# Patient Record
Sex: Male | Born: 1980 | Hispanic: Yes | Smoking: Never smoker
Health system: Southern US, Community
[De-identification: ages and names within clinical notes are randomized; demographics above are authoritative.]

## PROBLEM LIST (undated history)

## (undated) HISTORY — PX: FRACTURE SURGERY: SHX138

## (undated) HISTORY — PX: NASAL SINUS SURGERY: SHX719

---

## 2007-07-02 ENCOUNTER — Emergency Department (HOSPITAL_COMMUNITY): Admission: EM | Admit: 2007-07-02 | Discharge: 2007-07-03 | Payer: Self-pay | Admitting: Emergency Medicine

## 2011-06-05 LAB — URINALYSIS, ROUTINE W REFLEX MICROSCOPIC
Bilirubin Urine: NEGATIVE
Glucose, UA: NEGATIVE
Hgb urine dipstick: NEGATIVE
Ketones, ur: NEGATIVE
Nitrite: NEGATIVE
Protein, ur: NEGATIVE
Specific Gravity, Urine: 1.029
pH: 6.5

## 2013-04-30 ENCOUNTER — Emergency Department (HOSPITAL_BASED_OUTPATIENT_CLINIC_OR_DEPARTMENT_OTHER)
Admission: EM | Admit: 2013-04-30 | Discharge: 2013-04-30 | Disposition: A | Discharge status: Home / Self Care | Payer: Worker's Compensation | Attending: Emergency Medicine | Admitting: Emergency Medicine

## 2013-04-30 ENCOUNTER — Emergency Department (HOSPITAL_BASED_OUTPATIENT_CLINIC_OR_DEPARTMENT_OTHER): Payer: Worker's Compensation

## 2013-04-30 ENCOUNTER — Encounter (HOSPITAL_BASED_OUTPATIENT_CLINIC_OR_DEPARTMENT_OTHER): Payer: Self-pay | Admitting: *Deleted

## 2013-04-30 DIAGNOSIS — S92302A Fracture of unspecified metatarsal bone(s), left foot, initial encounter for closed fracture: Secondary | ICD-10-CM

## 2013-04-30 DIAGNOSIS — Y929 Unspecified place or not applicable: Secondary | ICD-10-CM | POA: Insufficient documentation

## 2013-04-30 DIAGNOSIS — IMO0002 Reserved for concepts with insufficient information to code with codable children: Secondary | ICD-10-CM | POA: Insufficient documentation

## 2013-04-30 DIAGNOSIS — Y9389 Activity, other specified: Secondary | ICD-10-CM | POA: Insufficient documentation

## 2013-04-30 MED ORDER — HYDROCODONE-ACETAMINOPHEN 5-325 MG PO TABS: 2.0000 | ORAL_TABLET | ORAL | Status: DC | PRN

## 2013-04-30 NOTE — ED Notes (Signed)
D/c home with rx x 1 for hydrocodone and note for work- friend at bedside assisting with interpreting- pt's english limited

## 2013-04-30 NOTE — ED Provider Notes (Signed)
CSN: [id number]     Arrival date & time 04/30/13  1745 History   First MD Initiated Contact with Patient 04/30/13 1838     Chief Complaint  Patient presents with  . Finger Injury   (Consider location/radiation/quality/duration/timing/severity/associated sxs/prior Treatment) Patient is a 32 y.o. male presenting with hand pain. The history is provided by the patient. No language interpreter was used.  Hand Pain This is a new problem. The current episode started today. The problem occurs constantly. The problem has been gradually worsening. Associated symptoms include joint swelling and myalgias. Nothing aggravates the symptoms. He has tried nothing for the symptoms. The treatment provided moderate relief.  Pt complains of pain in left 4th finger.  History reviewed. No pertinent past medical history. Past Surgical History  Procedure Laterality Date  . Nasal sinus surgery     No family history on file. History  Substance Use Topics  . Smoking status: Never Smoker   . Smokeless tobacco: Not on file  . Alcohol Use: Yes     Comment: occassionally    Review of Systems  Musculoskeletal: Positive for myalgias and joint swelling.  All other systems reviewed and are negative.    Allergies  Review of patient's allergies indicates no known allergies.  Home Medications  No current outpatient prescriptions on file. BP 125/77  Pulse 68  Temp(Src) 98.9 F (37.2 C) (Oral)  Resp 16  SpO2 100% Physical Exam  Nursing note and vitals reviewed. Constitutional: He is oriented to person, place, and time. He appears well-developed and well-nourished.  HENT:  Head: Normocephalic.  Musculoskeletal: He exhibits tenderness.  Tender swollen left 4th finger,   Pain with range of motion,  nv and ns intact  Neurological: He is alert and oriented to person, place, and time. He has normal reflexes.  Skin: Skin is warm.  Psychiatric: He has a normal mood and affect.    ED Course  Procedures  (including critical care time) Labs Review Labs Reviewed - No data to display Imaging Review Dg Finger Ring Left  04/30/2013   *RADIOLOGY REPORT*  Clinical Data: Blow to the left ring finger.  Pain.  LEFT RING FINGER 2+V  Comparison: None.  Findings: The patient has a nondisplaced fracture through the mid diaphysis of the proximal phalanx of the left ring finger with associated soft tissue swelling.  IMPRESSION: Nondisplaced fracture mid diaphysis proximal phalanx left ring finger.   Original Report Authenticated By: Holley Dexter, M.D.    MDM   1. Fracture of 4th metatarsal, left, closed, initial encounter    Finger splint, ice,   Follow up with Dr. Pearletha Forge for recheck next week    Elson Areas, PA-C 04/30/13 2027

## 2013-04-30 NOTE — ED Notes (Signed)
Patient states he was working today and hit his left fourth finger with a hammer.

## 2013-04-30 NOTE — ED Provider Notes (Signed)
Medical screening examination/treatment/procedure(s) were performed by non-physician practitioner and as supervising physician I was immediately available for consultation/collaboration.   Kenzleigh Sedam, MD 04/30/13 2348 

## 2014-01-25 ENCOUNTER — Ambulatory Visit: Payer: Self-pay | Admitting: Internal Medicine

## 2014-01-25 VITALS — BP 122/78 | HR 78 | Temp 98.7°F | Resp 16 | Ht 69.0 in | Wt 144.4 lb

## 2014-01-25 DIAGNOSIS — N342 Other urethritis: Secondary | ICD-10-CM

## 2014-01-25 DIAGNOSIS — R369 Urethral discharge, unspecified: Secondary | ICD-10-CM

## 2014-01-25 DIAGNOSIS — R300 Dysuria: Secondary | ICD-10-CM

## 2014-01-25 LAB — POCT URINALYSIS DIPSTICK
Bilirubin, UA: NEGATIVE
Glucose, UA: NEGATIVE
KETONES UA: NEGATIVE
Nitrite, UA: NEGATIVE
PH UA: 6.5
PROTEIN UA: NEGATIVE
Spec Grav, UA: 1.015
Urobilinogen, UA: 0.2

## 2014-01-25 LAB — POCT UA - MICROSCOPIC ONLY
CASTS, UR, LPF, POC: NEGATIVE
CRYSTALS, UR, HPF, POC: NEGATIVE
MUCUS UA: NEGATIVE
YEAST UA: NEGATIVE

## 2014-01-25 MED ORDER — CEFTRIAXONE SODIUM 1 G IJ SOLR
500.0000 mg | Freq: Once | INTRAMUSCULAR | Status: AC
  Administered 2014-01-25: 500 mg via INTRAMUSCULAR

## 2014-01-25 MED ORDER — DOXYCYCLINE HYCLATE 100 MG PO TABS: 100.0000 mg | ORAL_TABLET | Freq: Two times a day (BID) | ORAL | Status: DC

## 2014-01-25 NOTE — Patient Instructions (Signed)
Enfermedades de transmisin sexual (Sexually Transmitted Disease) Una enfermedad de transmisin sexual (ETS) es toda infeccin que se contagia (trasmite) de Neomia Dear persona a otra durante la actividad sexual. Puede transmitirse a travs de la saliva, el semen, la [place], el moco vaginal o la [place]. Las enfermedades de transmisin sexual ms frecuentes son:   Bettey Mare.  Clamidia.  Sfilis.  VIH y [place].  Herpes genital.  Hepatitis B y C.  Tricomonas.  Virus del papiloma humano - (VPH).  [place].  Escabiosis.  [place].  Vaginosis bacteriana. CULES SON LAS CAUSAS DE LAS ETS? Una ETS se transmite por bacterias, virus o parsitos. Las ETS se contagian durante la actividad sexual si una de las personas est infectada. Sin embargo, tambin puede trasmitirse por medios no sexuales. Las ETS se trasmiten despus de:   Tener contacto sexual con un compaero infectado.  Compartir juguetes sexuales.  Compartir agujas con una persona infectada o intercambiar piercings o agujas de tatuajes.  Tener un contacto ntimo con los genitales, la boca, o la zona anal de una persona infectada.  Exposicin a los fluidos infectados durante el nacimiento. CULES SON LOS SIGNOS Y SNTOMAS DE UNA INFECCIN POR ETS? Las distintas enfermedades de transmisin sexual tienen diferentes sntomas. Algunas personas no tienen sntomas. Si se presentan sntomas, stos pueden ser:   Miccin dolorosa o con sangre.  Dolor en la pelvis, el abdomen, la vagina, el ano, la garganta o los ojos.  Erupcin cutnea, picazn, irritacin, llagas (lesiones), lceras, o verrugas en la zona genital o anal.  Flujo vaginal anormal con o sin mal olor.  En los hombres, secrecin peniana.  Grant Ruts.  Dolor o Physiological scientist.  Inflamacin de los ganglios en la zona de la ingle.  Piel y ojos amarillos (ictericia). Esto se observa con la hepatitis.  Hinchazn de los  testculos.  Infertilidad.  Llagas y ampollas en la boca. CMO SE DIAGNOSTICAN LAS ETS? Para realizar un diagnstico, el mdico:   Tax inspector.  Realizar un examen fsico.  [place] de cualquier secrecin que presente para analizarla.  Tomar una muestra de la garganta, el cuello uterino, la abertura del pene, el recto o la vagina para el [place].  Analizar una muestra de la primera orina de la [place].  Le pedir DIRECTV.  Si corresponde, le tomar la [place] para un [place].  Har una colposcopa.  Har una laparoscopa. CMO SE TRATAN LAS ETS? El tratamiento depende de cul sea la ETS. Algunas ETS pueden tratarse pero no tienen [place].   Clamidia, gonorrea, tricomonas y sfilis pueden curarse con antibiticos.  El herpes genital, la hepatitis y el VIH pueden tratarse, pero no curarse, con medicamentos recetados. Los [employer].  Las Physiological scientist VPH se extirpan utilizando medicamentos, congelamiento, Mining engineer (electro-cauterizacin) o [place]. Las IT consultant.  El VPH es un virus y no puede curarse con medicamentos o [place]. Sin embargo pueden extirparse zonas anormales del cuello del tero, la vagina o la vulva.  Si el diagnstico se confirma por un cultivo u otro mtodo, sus compaeros sexuales ms recientes necesitan recibir TEFL teacher. Debe realizarlo aunque no tenga problemas o su cultivo o evaluacin sean negativos. No debe tener relaciones sexuales hasta que el profesional lo autorice. CMO PUEDO REDUCIR EL RIESGO DE TENER UNA ETS?  Use condones de ltex y lubricantes solubles en agua durante la actividad sexual.No utilice vaselina ni aceites.  Aplquese las vacunas para el HPV y la hepatitis. Si  no ha recibido Scientist, product/process development, consulte a su mdico si debe aplicarse una o ambas.  Evite las prcticas sexuales riesgosas que puedan lacerar la piel. QU DEBO HACER SI  CREO QUE TENGO UNA ETS?  Consulte a su mdico.  Informar a todos sus International aid/development worker. Ellos deben ser evaluados y recibir tratamiento.  No tenga relaciones sexuales hasta que el mdico lo autorice. CUNDO PEDIR AYUDA? Solicite atencin mdica de inmediato si:  Sufre un dolor abdominal intenso.  Usted es hombre y nota hinchazn o dolor en los testculos.  Usted es mujer y nota hinchazn o dolor en la vagina. Document Released: 05/23/2005 Document Revised: 06/03/2013 [facility] Patient Information 2014 [place], [place]. Bettey Mare (Gonorrhea) La gonorrea es una infeccin que puede causar graves problemas. Si no se trata puede:   Producir daos en los rganos femeninos o masculinos.  Es la causa de que algunas mujeres no puedan tener nios (esterilidad).  Puede daar al feto si la mujer infectada est embarazada. Es importante que reciba tratamiento para la gonorrea lo antes posible. Es necesario que todos sus compaeros sexuales sean examinados para diagnosticar la infeccin.  CAUSAS  La causa de la gonorrea es una bacteria llamada Neisseria gonorrhoeae. La infeccin se contagia de una persona a otra, generalmente por contacto sexual (por va anal, vaginal, u oral). Un recin nacido puede contraer la infeccin de su madre durante el [place].  SNTOMAS  Algunas personas con gonorrea no tienen sntomas. Los sntomas pueden ser diferentes en hombres y en mujeres.  Mujeres: Los sntomas ms frecuentes son:   Radiographer, therapeutic parte baja del abdomen.  Tener fiebre con o sin escalofros. Otros sntomas son:   Flujo vaginal anormal.  Relaciones sexuales dolorosas.  Ardor o picazn en la vagina o en los labios.  Sangrado vaginal anormal.  Dolor al Beatrix Shipper.  Dolor de Set designer duracin (crnico) en la zona baja del abdomen, especialmente durante la menstruacin o las [employer].  Imposibilidad de quedar embarazada.  Trabajo de [employer].  Irritacin, dolor, sangrado o  secrecin por el recto. Esto puede ocurrir si la infeccin se contagi durante sexo anal.  Dolor de garganta o ganglios linfticos inflamados en el cuello. Esto puede ocurrir si la infeccin se contagi durante sexo oral. Varones Los sntomas ms frecuentes son:   Secrecin por el pene.  Sensacin de dolor o ardor al [employer].  Dolor o hinchazn en los testculos. Otros sntomas son:   Irritacin, dolor, sangrado o secrecin por el recto. Esto puede ocurrir si la infeccin se contagi durante sexo anal.  Dolor de garganta, fiebre o ganglios linfticos inflamados en el cuello. Esto puede ocurrir si la infeccin se contagi durante sexo oral. DIAGNSTICO  El diagnstico se realiza luego de un examen fsico y de examinar una muestra de secreciones en el microscopio para detectar la presencia de la bacteria. La secrecin podr tomarse de la uretra, el cuello del tero, la garganta o el recto.  TRATAMIENTO  La gonorrea se trata con antibiticos. Es importante que inicie el tratamiento lo antes posible. El tratamiento precoz puede prevenir el desarrollo de otras enfermedades.  INSTRUCCIONES PARA EL CUIDADO EN EL HOGAR   Slo tome medicamentos de venta libre o recetados para Primary school teacher, Environmental health practitioner o bajar la fiebre, segn las indicaciones de su mdico.  Administre los antibiticos como se le indic. Finalice la prescripcin completa, aunque se sienta mejor. Un tratamiento incompleto lo pondr en riesgo de continuar con la infeccin.  No tenga relaciones Geographical information systems officer  el tratamiento, o segn las indicaciones del mdico.  Concurra a las consultas de control con su mdico segn las indicaciones.  No todos los resultados estarn disponibles durante su visita. En este caso, tenga otra entrevista con su mdico para conocerlos. No suponga que es normal si no tiene noticias de su mdico o del establecimiento de salud. Es Copyimportante el seguimiento de todos los [place]resultados de [place]  anlisis.  Si el estudio para la D.R. Horton, Incgonorrea le dio positivo, informe a sus Chiropractorcompaeros sexuales recientes. Ellos deben ITT Industrieshacerse los estudios para la gonorrea aunque no tengan sntomas. Podran necesitar tratamiento aunque el resultado del estudio haya sido negativo para la [place] Viewgonorrea. SOLICITE ATENCIN MDICA SI:   Tiene una reaccin adversa a los Research scientist (physical sciences)medicamentos que le prescribieron. Este puede incluir:  Una erupcin.  Nuseas.  Vmitos.  Diarrea.  Sus sntomas no han mejorado despus de 3 das de tratamiento con antibiticos.  Los sntomas empeoran.  Aumenta el dolor en los testculos (los hombres) o en el abdomen (las mujeres). SOLICITE ATENCIN MDICA DE INMEDIATO SI:  Tiene fiebre o sntomas persistentes durante ms de 2 - 3 das.  Tiene fiebre y los sntomas empeoran repentinamente. ASEGRESE DE QUE:   Comprende estas instrucciones.  Controlar su afeccin.  Recibir ayuda de inmediato si no mejora o si empeora. Document Released: 05/23/2005 Document Revised: 06/03/2013 [facility] Patient Information 2014 [place], [place]. Sexually Transmitted Disease A sexually transmitted disease (STD) is a disease or infection that may be passed (transmitted) from person to person, usually during sexual activity. This may happen by way of saliva, semen, blood, vaginal mucus, or urine. Common STDs include:   Gonorrhea.   Chlamydia.   Syphilis.   HIV and AIDS.   Genital herpes.   Hepatitis B and C.   Trichomonas.   Human papillomavirus (HPV).   Pubic lice.   Scabies.  Mites.  Bacterial vaginosis. WHAT ARE CAUSES OF STDs? An STD may be caused by bacteria, a virus, or parasites. STDs are often transmitted during sexual activity if one person is infected. However, they may also be transmitted through nonsexual means. STDs may be transmitted after:   Sexual intercourse with an infected person.   Sharing sex toys with an infected person.   Sharing needles with an  infected person or using unclean piercing or tattoo needles.  Having intimate contact with the genitals, mouth, or rectal areas of an infected person.   Exposure to infected fluids during birth. WHAT ARE THE SIGNS AND SYMPTOMS OF STDs? Different STDs have different symptoms. Some people may not have any symptoms. If symptoms are present, they may include:   Painful or bloody urination.   Pain in the pelvis, abdomen, vagina, anus, throat, or eyes.   Skin rash, itching, irritation, growths, sores (lesions), ulcerations, or warts in the genital or anal area.  Abnormal vaginal discharge with or without bad odor.   Penile discharge in men.   Fever.   Pain or bleeding during sexual intercourse.   Swollen glands in the groin area.   Yellow skin and eyes (jaundice). This is seen with hepatitis.   Swollen testicles.  Infertility.  Sores and blisters in the mouth. HOW ARE STDs DIAGNOSED? To make a diagnosis, your health care provider may:   Take a medical history.   Perform a physical exam.   Take a sample of any discharge for examination.  Swab the throat, cervix, opening to the penis, rectum, or vagina for testing.  Test a sample of your first morning urine.  Perform blood tests.   Perform a Pap smear, if this applies.   Perform a colposcopy.   Perform a laparoscopy.  HOW ARE STDs TREATED? Treatment depends on the STD. Some STDs may be treated but not cured.   Chlamydia, gonorrhea, trichomonas, and syphilis can be cured with antibiotics.   Genital herpes, hepatitis, and HIV can be treated, but not cured, with prescribed medicines. The medicines lessen symptoms.   Genital warts from HPV can be treated with medicine or by freezing, burning (electrocautery), or surgery. Warts may come back.   HPV cannot be cured with medicine or surgery. However, abnormal areas may be removed from the cervix, vagina, or vulva.   If your diagnosis is confirmed,  your recent sexual partners need treatment. This is true even if they are symptom-free or have a negative culture or evaluation. They should not have sex until their health care providers say it is OK. HOW CAN I REDUCE MY RISK OF GETTING AN STD?  Use latex condoms, dental dams, and water-soluble lubricants during sexual activity. Do not use petroleum jelly or oils.  Get vaccinated for HPV and hepatitis. If you have not received these vaccines in the past, talk to your health care provider about whether one or both might be right for you.   Avoid risky sex practices that can break the skin.  WHAT SHOULD I DO IF I THINK I HAVE AN STD?  See your health care provider.   Inform all sexual partners. They should be tested and treated for any STDs.  Do not have sex until your health care provider says it is OK. WHEN SHOULD I GET HELP? Seek immediate medical care if:  You develop severe abdominal pain.  You are a man and notice swelling or pain in the testicles.  You are a woman and notice swelling or pain in your vagina. Document Released: 11/03/2002 Document Revised: 06/03/2013 Document Reviewed: 03/03/2013 [facility] Patient Information 2014 [place], [place].

## 2014-01-25 NOTE — Progress Notes (Signed)
   Subjective:    Patient ID: Steven Farley, male    DOB: 01-Jun-1981, 33 y.o.   MRN: [id number]  HPI    Review of Systems     Objective:   Physical Exam        Assessment & Plan:

## 2014-01-25 NOTE — Progress Notes (Signed)
   Subjective:    Patient ID: Steven Farley, male    DOB: 11/19/1980, 33 y.o.   MRN: [id number]  HPI 33 year old male here for dysuria. The pain started yesterday. He is having a discharge right before he urinates. Tip of penis is swollen    Review of Systems     Objective:   Physical Exam  Constitutional: He appears well-developed and well-nourished. No distress.  HENT:  Head: Normocephalic.  Eyes: Pupils are equal, round, and reactive to light. No scleral icterus.  Neck: Normal range of motion.  Pulmonary/Chest: Effort normal.  Abdominal: Hernia confirmed negative in the right inguinal area and confirmed negative in the left inguinal area.  Genitourinary: Testes normal. Circumcised. Penile erythema and penile tenderness present. Discharge found.  Purulent penile discharge  Lymphadenopathy:       Right: No inguinal adenopathy present.       Left: No inguinal adenopathy present.     Results for orders placed in visit on 01/25/14  POCT URINALYSIS DIPSTICK      Result Value Ref Range   Color, UA yellow     Clarity, UA clear     Glucose, UA neg     Bilirubin, UA neg     Ketones, UA neg     Spec Grav, UA 1.015     Blood, UA small     pH, UA 6.5     Protein, UA neg     Urobilinogen, UA 0.2     Nitrite, UA neg     Leukocytes, UA small (1+)    POCT UA - MICROSCOPIC ONLY      Result Value Ref Range   WBC, Ur, HPF, POC 8-12     RBC, urine, microscopic 2-5     Bacteria, U Microscopic 1+     Mucus, UA neg     Epithelial cells, urine per micros 0-1     Crystals, Ur, HPF, POC neg     Casts, Ur, LPF, POC neg     Yeast, UA neg          Assessment & Plan:  Rocephin 5oomg Doxycycline 100mg  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

## 2014-01-26 LAB — GC/CHLAMYDIA PROBE AMP
CT PROBE, AMP APTIMA: NEGATIVE
GC Probe RNA: POSITIVE — AB

## 2014-01-27 ENCOUNTER — Encounter: Payer: Self-pay | Admitting: *Deleted

## 2014-11-09 ENCOUNTER — Emergency Department (INDEPENDENT_AMBULATORY_CARE_PROVIDER_SITE_OTHER)
Admission: EM | Admit: 2014-11-09 | Discharge: 2014-11-09 | Disposition: A | Discharge status: Home / Self Care | Payer: Self-pay | Source: Home / Self Care | Attending: Family Medicine | Admitting: Family Medicine

## 2014-11-09 ENCOUNTER — Other Ambulatory Visit (HOSPITAL_COMMUNITY)
Admission: RE | Admit: 2014-11-09 | Discharge: 2014-11-09 | Disposition: A | Discharge status: Home / Self Care | Payer: Self-pay | Source: Ambulatory Visit | Attending: Family Medicine | Admitting: Family Medicine

## 2014-11-09 ENCOUNTER — Encounter (HOSPITAL_COMMUNITY): Payer: Self-pay | Admitting: Emergency Medicine

## 2014-11-09 DIAGNOSIS — Z113 Encounter for screening for infections with a predominantly sexual mode of transmission: Secondary | ICD-10-CM | POA: Insufficient documentation

## 2014-11-09 DIAGNOSIS — R350 Frequency of micturition: Secondary | ICD-10-CM

## 2014-11-09 LAB — POCT URINALYSIS DIP (DEVICE)
Bilirubin Urine: NEGATIVE
GLUCOSE, UA: NEGATIVE mg/dL
Ketones, ur: NEGATIVE mg/dL
Leukocytes, UA: NEGATIVE
Nitrite: NEGATIVE
PH: 6.5 (ref 5.0–8.0)
Protein, ur: NEGATIVE mg/dL
UROBILINOGEN UA: 0.2 mg/dL (ref 0.0–1.0)

## 2014-11-09 LAB — GLUCOSE, CAPILLARY: GLUCOSE-CAPILLARY: 91 mg/dL (ref 70–99)

## 2014-11-09 MED ORDER — DOXYCYCLINE HYCLATE 100 MG PO CAPS: 100.0000 mg | ORAL_CAPSULE | Freq: Two times a day (BID) | ORAL | Status: DC

## 2014-11-09 NOTE — Discharge Instructions (Signed)
Poliaquiuria  °(Urinary Frequency) ° El número de veces que orina una persona sana depende de la cantidad de líquido que ingiere y la cantidad de líquido que pierde. Cuando hace calor y hay mucha humedad, la persona transpira más y la respiración es más rápida. Estos factores disminuyen la frecuencia de la micción que se considera normal.  °La cantidad de líquido que se bebe es fácil de determinar, pero la cantidad de líquido que se pierde es más difícil de calcular.  °El líquido se pierde en dos formas:  °· La pérdida perceptible de líquidos se mide por la cantidad de orina que se elimina. También hay pérdida de líquido también con la diarrea. °· La pérdida imperceptible de líquidos es más difícil de medir. La causa es la evaporación. La pérdida insensible de líquido se produce a través de la respiración y la sudoración. Por lo general oscila alrededor de un litro por día. °En temperaturas y niveles de actividad normales una persona promedio puede orinar 4-7 veces en un período de 24 horas. La necesidad de orinar con más frecuencia podría indicar que hay un problema. Si una persona orina entre 4 y 7 veces en 24 horas y elimina grandes volúmenes cada vez, podría indicar un problema diferente del que orine 4 a 7 veces al día y elimine pequeños volúmenes. El momento en que orina también es importante. Generalmente se orina durante las horas de vigilia. Levantarse con frecuencia por la noche puede indicar algunos problemas.  °CAUSAS  °La vejiga es el órgano que se encuentra en la zona baja del abdomen y que contiene la orina. Como un globo, se hincha un poco a medida que se llena. Las terminales nerviosas lo perciben y envían la información de que es hora de ir al baño. Hay una serie de causas por las que se siente la necesidad de orinar con más frecuencia de lo habitual. Ellos son:  °· Infección del tracto urinario. Esto generalmente se asocia con otros síntomas tales como ardor al orinar. °· En hombres, los problemas  de la próstata (una glándula del tamaño de una nuez que se encuentra cerca del conducto por el que se elimina la orina del organismo). Hay dos razones por las que la próstata puede causar un aumento en la frecuencia de la micción: °¨ El agrandamiento de la próstata que no deja que la vejiga se vacíe bien. Si la vejiga se vacía a medias sólo tiene la mitad de la capacidad de llenarla antes de orinar de nuevo. °¨ Los nervios de la vejiga se vuelven más hipersensibles a un aumento del tamaño de la próstata, incluso si la vejiga se vacía completamente. °· Embarazo. °· Obesidad. El exceso de peso causa más problemas en las mujeres que en los hombres. °· Cálculos u otros problemas en la vejiga. °· La cafeína. °· Alcohol. °· Medicamentos. Por ejemplo, los medicamentos que permiten eliminar el exceso de líquido del organismo (diuréticos) aumentan la producción de orina. Algunos medicamentos deben tomarse con mucho líquido. °· Debilidad muscular o nerviosa. Podría ser el resultado de una lesión en la médula espinal, un ictus, esclerosis múltiple o la enfermedad de Parkinson. °· Cuando se ha sufrido diabetes durante mucho tiempo, puede haber una disminución de la sensación de la vejiga. Esta pérdida de sensibilidad hace que sea más difícil detectar que hay que vaciar la vejiga. Durante muchos años la vejiga se expande por sobrellenado constante. Esto debilita sus músculos de modo que la vejiga no se vacía bien y tiene menos capacidad   para llenarse nuevamente. °· Cistitis intersticial (también llamado síndrome de vejiga dolorosa). La enfermedad se desarrolla debido a que los tejidos que recubren el interior de la vejiga se inflaman (la inflamación es la manera que tiene el organismo de reaccionar a una lesión o infección). Causa dolor y necesidad frecuente de orinar. Ocurre con más frecuencia en mujeres que en hombres. °DIAGNÓSTICO  °· Para diagnosticar la causa de la poliaquiuria, el médico: °¨ Le preguntará sobre los  síntomas. °¨ Preguntará acerca de su salud en general. Incluirá preguntas sobre los medicamentos que esté tomando. °¨ Le hará un examen físico. °· Indicará algunos estudios. Estos pueden incluir: °¨ Un análisis de sangre para detectar diabetes u otros problemas de salud que podrían estar contribuyendo al problema. °¨ Análisis de orina. Análisis de orina para medir el flujo de orina y la presión sobre la vejiga. °¨ Estudios del sistema neurológico (el cerebro, la médula espinal y los nervios). Este es el sistema que detecta la sensación de orinar. °¨ Estudios de la vejiga para verificar si se vacía por completo al orinar. °¨ Citoscopía. En este estudio se utiliza un tubo delgado con una pequeña cámara. Podrá observarse el interior de la uretra y la vejiga para ver si hay problemas. °¨ Diagnóstico por imágenes. Le administrarán una sustancia de contraste y luego le pedirán que orine Luego se toman radiografías de la vejiga para ver su funcionamiento. °TRATAMIENTO  °Es importante que sea evaluado para determinar si la cantidad o la frecuencia con la que orina es anormal o inusual. Si se comprueba que no es normal, debe determinarse la causa y por lo general se puede hallar con facilidad. Según sea la causa, el tratamiento podría incluir medicamentos, la estimulación de los nervios o cirugía.  °No hay muchas cosas que usted pueda hacer por su cuenta para modificar la poliaquiuria. Es importante equilibrar la ingestión de líquidos necesaria para compensar su actividad y la temperatura. Los problemas médicos serán diagnosticados y tratados por su médico. No hay entrenamiento de la vejiga en particular, como los ejercicios de Kegel que usted pueda hacer para mejorar la poliaquiuria. Este es un ejercicio que normalmente deben realizar las personas que tienen pérdidas de orina cuando se ríen, tosen o estornudan.  °INSTRUCCIONES PARA EL CUIDADO EN EL HOGAR  °· Tome todos los medicamentos que su médico le ha recetado o  sugerido. Siga cuidadosamente las indicaciones. °· Realice cambios en su estilo de vida, según las indicaciones. Estos pueden incluir: °¨ Beber menos líquidos o beber en diferentes momentos del día. Por ejemplo, si necesita orinar con frecuencia durante la noche, deberá dejar de tomar líquidos temprano a la noche. °¨ Reducir el consumo de cafeína o de alcohol. Ambos aumentan la necesidad de orinar más de lo normal. La cafeína se encuentra en las gaseosas, el té y el chocolate. °¨ Baje de peso, si se lo indican. °· Lleve un diario o registro. Posiblemente le pedirán que registre la cantidad que bebe y cuando, y el momento en que sienta la necesidad de orinar. Esto también lo ayudará a evaluar si el tratamiento que le ha indicado el médico funciona. °SOLICITE ATENCIÓN MÉDICA SI:  °· La necesidad de orinar con frecuencia aumenta. °· Se siente más dolor o irritación al orinar. °· Observa sangre en la orina. °· Usted tiene preguntas sobre cualquier medicamento que su médico ha recomendado. °· Observa sangre, pus, aumento de la inflamación en los lugares en que le hicieron las pruebas o el tratamiento. °· La temperatura eleva   por encima de 100,5°F (38,1°C). °SOLICITE ATENCIÓN MÉDICA DE INMEDIATO SI:  °La temperatura eleva a más de 102,5°F (38,9°C).  °Document Released: 05/07/2012 °Exit[employer]® Patient Information ©2015 [employer]. This information is not intended to replace advice given to you by your health care provider. Make sure you discuss any questions you have with your health care provider. ° °

## 2014-11-09 NOTE — ED Provider Notes (Signed)
CSN: [id number]     Arrival date & time 11/09/14  1727 History   First MD Initiated Contact with Patient 11/09/14 1918     Chief Complaint  Patient presents with  . Urinary Retention   (Consider location/radiation/quality/duration/timing/severity/associated sxs/prior Treatment) HPI Comments: No concerns regarding STIs Patient reports himself to be uncircumcised   Patient is a 34 y.o. male presenting with frequency. The history is provided by the patient.  Urinary Frequency This is a new problem. The current episode started yesterday. The problem occurs constantly. The problem has not changed since onset.   History reviewed. No pertinent past medical history. Past Surgical History  Procedure Laterality Date  . Nasal sinus surgery    . Fracture surgery      Nose (1991)   Family History  Problem Relation Age of Onset  . Hypertension Brother    History  Substance Use Topics  . Smoking status: Never Smoker   . Smokeless tobacco: Not on file  . Alcohol Use: Yes     Comment: occassionally    Review of Systems  Constitutional: Negative for fever, chills and appetite change.  HENT: Negative.   Respiratory: Negative.   Cardiovascular: Negative.   Gastrointestinal: Negative.   Endocrine: Negative for polydipsia, polyphagia and polyuria.  Genitourinary: Positive for urgency and frequency. Negative for dysuria, hematuria, flank pain, decreased urine volume, discharge, penile swelling, scrotal swelling, genital sores, penile pain and testicular pain.  Musculoskeletal: Negative for myalgias and back pain.  Skin: Negative.   Allergic/Immunologic: Negative for immunocompromised state.  Neurological: Negative for dizziness, weakness and light-headedness.    Allergies  Review of patient's allergies indicates no known allergies.  Home Medications   Prior to Admission medications   Medication Sig Start Date End Date Taking? Authorizing Provider  doxycycline (VIBRAMYCIN) 100 MG  capsule Take 1 capsule (100 mg total) by mouth 2 (two) times daily. X 7 days 11/09/14   Ria Clock, PA  HYDROcodone-acetaminophen (NORCO/VICODIN) 5-325 MG per tablet Take 2 tablets by mouth every 4 (four) hours as needed. 04/30/13   Elson Areas, PA-C   BP 136/71 mmHg  Pulse 63  Temp(Src) 97.8 F (36.6 C) (Oral)  Resp 20  SpO2 100% Physical Exam  Constitutional: He is oriented to person, place, and time. He appears well-developed and well-nourished. No distress.  HENT:  Head: Normocephalic and atraumatic.  Eyes: Conjunctivae are normal.  Cardiovascular: Normal rate, regular rhythm and normal heart sounds.   Pulmonary/Chest: Effort normal and breath sounds normal.  Abdominal: Soft. Normal appearance and bowel sounds are normal. He exhibits no distension. There is no tenderness. There is no rigidity, no rebound, no guarding and no CVA tenderness.  Genitourinary:  Declines this portion of exam  Musculoskeletal: Normal range of motion.  Neurological: He is alert and oriented to person, place, and time.  Skin: Skin is warm and dry. No rash noted. No erythema.  Psychiatric: He has a normal mood and affect. His behavior is normal.  Nursing note and vitals reviewed.   ED Course  Procedures (including critical care time) Labs Review Labs Reviewed  POCT URINALYSIS DIP (DEVICE) - Abnormal; Notable for the following:    Hgb urine dipstick TRACE (*)    All other components within normal limits  URINE CULTURE  GLUCOSE, CAPILLARY  URINE CYTOLOGY ANCILLARY ONLY    Imaging Review No results found.   MDM   1. Urinary frequency     Vitals normal  Exam unremarkable UA with trace hematuria  Patient declines exam of genitalia, so I cannot elaborate if penile lesions, discharge or physical finding on exam to explain symptoms CBG normal Patient denies concerns for STIs. Urine cytology and C&S pending. Will treat with one week course of doxycycline and advise follow up if no  improvement.    Ria ClockJennifer Lee H Alexandre Faries, GeorgiaPA 11/09/14 2013

## 2014-11-09 NOTE — ED Notes (Signed)
Pt reports with urinating after he is done he still has the sensation of having to urinate.  On set last night.  Denies fever, n/v/d.  No lower back pain, urgency, or frequency.

## 2014-11-10 LAB — URINE CYTOLOGY ANCILLARY ONLY
Chlamydia: NEGATIVE
NEISSERIA GONORRHEA: NEGATIVE
TRICH (WINDOWPATH): NEGATIVE

## 2014-11-11 LAB — URINE CULTURE
Colony Count: NO GROWTH
Culture: NO GROWTH

## 2015-08-12 ENCOUNTER — Ambulatory Visit (INDEPENDENT_AMBULATORY_CARE_PROVIDER_SITE_OTHER): Payer: Self-pay | Admitting: Family Medicine

## 2015-08-12 VITALS — BP 114/82 | HR 83 | Temp 99.2°F | Resp 16 | Ht 69.0 in | Wt 142.0 lb

## 2015-08-12 DIAGNOSIS — R682 Dry mouth, unspecified: Secondary | ICD-10-CM

## 2015-08-12 LAB — POCT URINALYSIS DIP (MANUAL ENTRY)
BILIRUBIN UA: NEGATIVE
BILIRUBIN UA: NEGATIVE
Glucose, UA: NEGATIVE
Leukocytes, UA: NEGATIVE
Nitrite, UA: NEGATIVE
PH UA: 5.5
Spec Grav, UA: >=1.03
Urobilinogen, UA: 0.2

## 2015-08-12 LAB — POC MICROSCOPIC URINALYSIS (UMFC): Mucus: ABSENT

## 2015-08-12 LAB — GLUCOSE, POCT (MANUAL RESULT ENTRY): POC Glucose: 124 mg/dl — AB (ref 70–99)

## 2015-08-12 NOTE — Progress Notes (Signed)
[facility] [address], [place] [address] [phone]  Date:  08/12/2015   Name:  Steven Farley   DOB:  27-Feb-1981   MRN:  [id number]  PCP:  No PCP Per Patient    Chief Complaint: Other   History of Present Illness:  Steven Farley is a 34 y.o. very pleasant male patient who presents with the following:  Generally healthy man here today with complaint of his mouth being dry and his lips are "sticking together like I have glue on my lips.' He has noticed this since last night.  Otherwise he feels well.  He states that he could not sleep well last night.    There are no active problems to display for this patient.   History reviewed. No pertinent past medical history.  Past Surgical History  Procedure Laterality Date  . Nasal sinus surgery    . Fracture surgery      Nose (1991)    Social History  Substance Use Topics  . Smoking status: Never Smoker   . Smokeless tobacco: None  . Alcohol Use: Yes     Comment: occassionally    Family History  Problem Relation Age of Onset  . Hypertension Brother     No Known Allergies  Medication list has been reviewed and updated.  Current Outpatient Prescriptions on File Prior to Visit  Medication Sig Dispense Refill  . doxycycline (VIBRAMYCIN) 100 MG capsule Take 1 capsule (100 mg total) by mouth 2 (two) times daily. X 7 days (Patient not taking: Reported on 08/12/2015) 14 capsule 0  . HYDROcodone-acetaminophen (NORCO/VICODIN) 5-325 MG per tablet Take 2 tablets by mouth every 4 (four) hours as needed. (Patient not taking: Reported on 08/12/2015) 20 tablet 0   No current facility-administered medications on file prior to visit.    Review of Systems:  As per HPI- otherwise negative.   Physical Examination: Filed Vitals:   08/12/15 1306  BP: 114/82  Pulse: 83  Temp: 99.2 F (37.3 C)  Resp: 16   Filed Vitals:   08/12/15 1306  Height:  (1.753 m)  Weight: 142 lb (64.411 kg)    Body mass index is 20.96 kg/(m^2). Ideal Body Weight: Weight in (lb) to have BMI = 25: 168.9  GEN: WDWN, NAD, Non-toxic, A & O x 3, looks well HEENT: Atraumatic, Normocephalic. Neck supple. No masses, No LAD. Ears and Nose: No external deformity. CV: RRR, No M/G/R. No JVD. No thrill. No extra heart sounds. PULM: CTA B, no wheezes, crackles, rhonchi. No retractions. No resp. distress. No accessory muscle use. ABD: S, NT, ND, +BS. No rebound. No HSM. EXTR: No c/c/e NEURO Normal gait.  PSYCH: Normally interactive. Conversant. Not depressed or anxious appearing.  Calm demeanor.   Results for orders placed or performed in visit on 08/12/15  POCT urinalysis dipstick  Result Value Ref Range   Color, UA yellow yellow   Clarity, UA clear clear   Glucose, UA negative negative   Bilirubin, UA negative negative   Ketones, POC UA negative negative   Spec Grav, UA >=1.030    Blood, UA trace-lysed (A) negative   pH, UA 5.5    Protein Ur, POC trace (A) negative   Urobilinogen, UA 0.2    Nitrite, UA Negative Negative   Leukocytes, UA Negative Negative  POCT glucose (manual entry)  Result Value Ref Range   POC Glucose 124 (A) 70 - 99 mg/dl  POCT Microscopic Urinalysis ([facility])  Result  Value Ref Range   WBC,UR,HPF,POC None None WBC/hpf   RBC,UR,HPF,POC None None RBC/hpf   Bacteria None None, Too numerous to count   Mucus Absent Absent   Epithelial Cells, UR Per Microscopy None None, Too numerous to count cells/hpf      Assessment and Plan: Dry mouth - Plan: POCT urinalysis dipstick, POCT glucose (manual entry), POCT Microscopic Urinalysis ([facility])  Reassured that nothing seems to be seriously amiss.  He will drink more water and let me know if not feeling better  Signed Abbe AmsterdamJessica Guillermina Shaft, MD

## 2015-08-12 NOTE — Patient Instructions (Signed)
It appears that you are dehydrated and need to drink more liquids/ water.  Please drink plenty of fluids over the next couple of days and let us know if you do not feel back to normal

## 2016-04-20 ENCOUNTER — Ambulatory Visit (INDEPENDENT_AMBULATORY_CARE_PROVIDER_SITE_OTHER): Payer: Self-pay | Admitting: Physician Assistant

## 2016-04-20 ENCOUNTER — Encounter: Payer: Self-pay | Admitting: Physician Assistant

## 2016-04-20 VITALS — BP 120/76 | HR 90 | Temp 98.2°F | Resp 16 | Ht 68.5 in | Wt 136.8 lb

## 2016-04-20 DIAGNOSIS — R350 Frequency of micturition: Secondary | ICD-10-CM

## 2016-04-20 LAB — POCT URINALYSIS DIP (MANUAL ENTRY)
BILIRUBIN UA: NEGATIVE
Bilirubin, UA: NEGATIVE
Glucose, UA: NEGATIVE
LEUKOCYTES UA: NEGATIVE
NITRITE UA: NEGATIVE
PH UA: 5.5
PROTEIN UA: NEGATIVE
Spec Grav, UA: 1.025
UROBILINOGEN UA: 0.2

## 2016-04-20 LAB — POC MICROSCOPIC URINALYSIS (UMFC): Mucus: ABSENT

## 2016-04-20 MED ORDER — CEFTRIAXONE SODIUM 250 MG IJ SOLR
250.0000 mg | Freq: Once | INTRAMUSCULAR | Status: AC
  Administered 2016-04-20: 250 mg via INTRAMUSCULAR

## 2016-04-20 MED ORDER — AZITHROMYCIN 250 MG PO TABS: 1000.0000 mg | ORAL_TABLET | Freq: Once | ORAL | 0 refills | Status: AC

## 2016-04-20 NOTE — Progress Notes (Signed)
Patient ID: Steven Farley, male    DOB: 12/29/1980, 35 y.o.   MRN: [id number]  PCP: No PCP Per Patient  Subjective:   Chief Complaint  Patient presents with  . URINARY PROBLEMS    per patient when he pee feels like something is moving in penis,started last night    HPI Presents for evaluation of urinary problems. Symptoms began last night, between 8:30 and 9 pm.  After urinating, he still feels like he needs to urinate. Feels like there is always something moving inside the penis. Occasional mild dysuria. Increased urinary frequency. No urgency. No hematuria. No penile discharge. No fever, chills. No nausea or vomiting. No rash or lesion on the skin. No lymphadenopathy.  Sexually active with women. Most recently had sex with a woman last week that he'd never met before. Used condoms.  History of gonorrhea last year.   Review of Systems As above.    There are no active problems to display for this patient.    Prior to Admission medications   Not on File     Not on File     Objective:  Physical Exam  Constitutional: He is oriented to person, place, and time. He appears well-developed and well-nourished. He is active and cooperative. No distress.  BP 120/76 (BP Location: Left Arm, Patient Position: Sitting, Cuff Size: Normal)   Pulse 90   Temp 98.2 F (36.8 C) (Oral)   Resp 16   Ht 5' 8.5" (1.74 m)   Wt 136 lb 12.8 oz (62.1 kg)   SpO2 99%   BMI 20.50 kg/m   HENT:  Head: Normocephalic and atraumatic.  Right Ear: Hearing normal.  Left Ear: Hearing normal.  Eyes: Conjunctivae are normal. No scleral icterus.  Neck: Normal range of motion. Neck supple. No thyromegaly present.  Cardiovascular: Normal rate, regular rhythm and normal heart sounds.   Pulses:      Radial pulses are 2+ on the right side, and 2+ on the left side.  Pulmonary/Chest: Effort normal and breath sounds normal.  Abdominal: Hernia confirmed negative in the right inguinal area and  confirmed negative in the left inguinal area.  Genitourinary: Testes normal and penis normal. Uncircumcised.  Lymphadenopathy:       Head (right side): No tonsillar, no preauricular, no posterior auricular and no occipital adenopathy present.       Head (left side): No tonsillar, no preauricular, no posterior auricular and no occipital adenopathy present.    He has no cervical adenopathy.       Right: No inguinal and no supraclavicular adenopathy present.       Left: No inguinal and no supraclavicular adenopathy present.  Neurological: He is alert and oriented to person, place, and time. No sensory deficit.  Skin: Skin is warm, dry and intact. No rash noted. No cyanosis or erythema. Nails show no clubbing.  Psychiatric: He has a normal mood and affect. His speech is normal and behavior is normal.      Results for orders placed or performed in visit on 04/20/16  POCT urinalysis dipstick  Result Value Ref Range   Color, UA yellow yellow   Clarity, UA clear clear   Glucose, UA negative negative   Bilirubin, UA negative negative   Ketones, POC UA negative negative   Spec Grav, UA 1.025    Blood, UA trace-lysed (A) negative   pH, UA 5.5    Protein Ur, POC negative negative   Urobilinogen, UA 0.2    Nitrite,  UA Negative Negative   Leukocytes, UA Negative Negative  POCT Microscopic Urinalysis ([facility])  Result Value Ref Range   WBC,UR,HPF,POC None None WBC/hpf   RBC,UR,HPF,POC None None RBC/hpf   Bacteria None None, Too numerous to count   Mucus Absent Absent   Epithelial Cells, UR Per Microscopy None None, Too numerous to count cells/hpf        Assessment & Plan:   1. Urinary frequency Concern for GC/CT. Treat as such, pending uriprobe results. Advised against sex until he hears from me regarding the results. - POCT urinalysis dipstick - POCT Microscopic Urinalysis ([facility]) - GC/Chlamydia Probe Amp - cefTRIAXone (ROCEPHIN) injection 250 mg; Inject 250 mg into the muscle once. -  azithromycin (ZITHROMAX) 250 MG tablet; Take 4 tablets (1,000 mg total) by mouth once.  Dispense: 4 tablet; Refill: 0   Fara Chute, PA-C Physician Assistant-Certified [facility]

## 2016-04-20 NOTE — Patient Instructions (Signed)
     IF you received an x-ray today, you will receive an invoice from [facility]. Please contact [facility] at [phone] with questions or concerns regarding your invoice.   IF you received labwork today, you will receive an invoice from [facility]. Please contact [facility] at [phone] with questions or concerns regarding your invoice.   Our billing staff will not be able to assist you with questions regarding bills from these companies.  You will be contacted with the lab results as soon as they are available. The fastest way to get your results is to activate your My Chart account. Instructions are located on the last page of this paperwork. If you have not heard from us regarding the results in 2 weeks, please contact this office.      

## 2016-04-21 LAB — GC/CHLAMYDIA PROBE AMP
CT Probe RNA: NOT DETECTED
GC PROBE AMP APTIMA: NOT DETECTED

## 2016-04-26 ENCOUNTER — Ambulatory Visit: Payer: Self-pay

## 2016-04-26 ENCOUNTER — Other Ambulatory Visit: Payer: Self-pay | Admitting: *Deleted

## 2016-04-26 MED ORDER — METRONIDAZOLE 500 MG PO TABS: 500.0000 mg | ORAL_TABLET | Freq: Two times a day (BID) | ORAL | 0 refills | Status: AC

## 2016-04-28 ENCOUNTER — Ambulatory Visit (INDEPENDENT_AMBULATORY_CARE_PROVIDER_SITE_OTHER): Payer: Self-pay | Admitting: Urgent Care

## 2016-04-28 VITALS — BP 118/70 | HR 81 | Temp 98.3°F | Resp 18 | Ht 69.0 in | Wt 138.4 lb

## 2016-04-28 DIAGNOSIS — R350 Frequency of micturition: Secondary | ICD-10-CM

## 2016-04-28 DIAGNOSIS — N419 Inflammatory disease of prostate, unspecified: Secondary | ICD-10-CM

## 2016-04-28 DIAGNOSIS — R300 Dysuria: Secondary | ICD-10-CM

## 2016-04-28 LAB — POCT URINALYSIS DIP (MANUAL ENTRY)
BILIRUBIN UA: NEGATIVE
BILIRUBIN UA: NEGATIVE
Glucose, UA: NEGATIVE
Nitrite, UA: NEGATIVE
PROTEIN UA: NEGATIVE
Spec Grav, UA: 1.02
Urobilinogen, UA: 0.2
pH, UA: 5.5

## 2016-04-28 LAB — POCT CBC
Granulocyte percent: 72 %G (ref 37–80)
HEMATOCRIT: 40.2 % — AB (ref 43.5–53.7)
Hemoglobin: 13.9 g/dL — AB (ref 14.1–18.1)
LYMPH, POC: 1.5 (ref 0.6–3.4)
MCH, POC: 28.1 pg (ref 27–31.2)
MCHC: 34.6 g/dL (ref 31.8–35.4)
MCV: 81.2 fL (ref 80–97)
MID (cbc): 0.3 (ref 0–0.9)
MPV: 9.3 fL (ref 0–99.8)
POC GRANULOCYTE: 4.7 (ref 2–6.9)
POC LYMPH %: 23.1 % (ref 10–50)
POC MID %: 4.9 %M (ref 0–12)
Platelet Count, POC: 151 10*3/uL (ref 142–424)
RBC: 4.96 M/uL (ref 4.69–6.13)
RDW, POC: 13.2 %
WBC: 6.5 10*3/uL (ref 4.6–10.2)

## 2016-04-28 LAB — POC MICROSCOPIC URINALYSIS (UMFC)

## 2016-04-28 LAB — POCT GLYCOSYLATED HEMOGLOBIN (HGB A1C): HEMOGLOBIN A1C: 5.9

## 2016-04-28 MED ORDER — CIPROFLOXACIN HCL 500 MG PO TABS: 500.0000 mg | ORAL_TABLET | Freq: Two times a day (BID) | ORAL | 0 refills | Status: DC

## 2016-04-28 MED ORDER — ACYCLOVIR 400 MG PO TABS: 400.0000 mg | ORAL_TABLET | Freq: Three times a day (TID) | ORAL | 5 refills | Status: DC

## 2016-04-28 NOTE — Patient Instructions (Addendum)
Disuria (Dysuria) La disuria es dolor o molestia al [employer]. El dolor o la molestia se pueden sentir en el conducto que transporta la orina fuera de la vejiga (uretra) o en el tejido que rodea los genitales. El dolor tambin se puede sentir en la zona de la ingle y en la parte inferior del abdomen y de la espalda. Quizs tenga que orinar con frecuencia o la sensacin repentina de tener que orinar (tenesmo vesical). La disuria puede afectar tanto a hombres como a mujeres, pero es ms comn en las mujeres. La causa puede deberse a muchos problemas diferentes:  Infeccin en las vas urinarias en mujeres.  Infeccin en los riones o la vejiga.  Clculos en los riones o la vejiga.  Ciertas enfermedades de transmisin sexual (ETS), como la clamidia.  Deshidratacin.  Inflamacin de la vagina.  Uso de ciertos medicamentos.  Uso de ciertos jabones o productos perfumados que provocan irritacin. INSTRUCCIONES PARA EL CUIDADO EN EL HOGAR Controle su disuria para ver si hay cambios. Las siguientes indicaciones pueden ayudar a Psychologist, educational [employer] pueda sentir:  Beba suficiente lquido para Pharmacologist la orina clara o de color amarillo plido.  Vace la vejiga con frecuencia. Evite retener la orina durante largos perodos.  Despus de defecar, las mujeres deben limpiarse desde adelante hacia atrs, usando el papel higinico solo [place].  Vace la vejiga despus de Management consultant.  Tome los medicamentos solamente como se lo haya indicado el mdico.  Si le recetaron antibiticos, asegrese de terminarlos, incluso si comienza a sentirse mejor.  Evite la cafena, el t y el alcohol. Estos productos pueden Theatre manager vejiga y Probation officer disuria. En los hombres, el alcohol puede irritar la prstata.  Concurra a todas las visitas de control como se lo haya indicado el mdico. Esto es importante.  Si le realizaron pruebas para Landscape architect causa de la disuria, es su  responsabilidad retirar los [place]. Consulte en el laboratorio o en el departamento en el que fue realizado el estudio cundo y cmo podr [employer]. Hable con el mdico si tiene [employer]. SOLICITE ATENCIN MDICA SI:  Siente dolor en la espalda o a los costados del cuerpo.  Tiene fiebre.  Tiene nuseas o vmitos.  Observa sangre en la orina.  Est orinando con ms frecuencia que lo habitual. SOLICITE ATENCIN MDICA DE INMEDIATO SI:  El dolor es intenso y no se alivia con los medicamentos.  No puede retener lquido.  Usted u otra persona advierten algn cambio en su funcin mental.  Tiene una frecuencia cardaca acelerada en reposo.  Tiene temblores o escalofros.  Se siente muy dbil.   Esta informacin no tiene Theme park manager el consejo del mdico. Asegrese de hacerle al mdico cualquier pregunta que tenga.   Document Released: 09/02/2007 Document Revised: 09/03/2014 Elsevier Interactive Patient Education 2016 [employer].    Ciprofloxacin tablets Qu es este medicamento? La CIPROFLOXACINA es un antibitico quinolnico. Se utiliza en el tratamiento de ciertos tipos de infecciones bacterianas. No es efectivo para resfros, gripe u otras infecciones de origen viral. Este medicamento puede ser utilizado para otros usos; si tiene alguna pregunta consulte con su proveedor de atencin mdica o con su farmacutico. Qu le debo informar a mi profesional de la salud antes de tomar este medicamento? Necesita saber si usted presenta alguno de los siguientes problemas o situaciones: -problemas de huesos -enfermedad cerebral -problemas de articulaciones -pulso cardaco irregular -enfermedad renal -enfermedad heptica -miastenia gravis -trastorno  de convulsiones -problemas de tendones -una reaccin alrgica o inusual a la ciprofloxacina, a otros antibiticos, medicamentos, alimentos, colorantes o conservantes -si est  embarazada o buscando quedar embarazada -si est amamantando a un beb Cmo debo utilizar este medicamento? Tome este medicamento por va oral con un vaso de agua. Siga las instrucciones de la etiqueta del [place]. Tome sus dosis a intervalos regulares. No tome su medicamento con una frecuencia mayor a la indicada. Tome todas las dosis de su medicamento como se le haya indicado aun si se siente mejor. No omita ninguna dosis o suspenda el uso de su medicamento antes de lo indicado. Este Automatic Data se puede tomar con las comidas o con el estmago vaco. No lo debe tomar con productos lcteos, tales como [place], yogur o jugos fortificados con [employer], sin embargo lo puede tomar con comidas que contienen productos lcteos. Su farmacutico le dar una [place] del medicamento especial con cada receta y relleno. Asegrese de leer esta informacin cada vez cuidadosamente. Hable con su pediatra para informarse acerca del uso de este medicamento en nios. Puede requerir atencin especial. Sobredosis: Pngase en contacto inmediatamente con un centro toxicolgico o una sala de urgencia si usted cree que haya tomado demasiado medicamento. ATENCIN: [employer] es solo para usted. No comparta este medicamento con nadie. Qu sucede si me olvido de una dosis? Si olvida una dosis, tmela lo antes posible. Si es casi la hora de la prxima dosis, tome slo esa dosis. No tome dosis adicionales o dobles. Qu puede interactuar con este medicamento? No tome esta medicina con ninguno de los siguientes medicamentos: -cisapride -droperidol -terfenadina -tizanidina Esta medicina tambin puede interactuar con los siguientes medicamentos -anticidos -pldoras anticonceptivas -cafena -ciclosporina -polvo o tabletas tamponadas de didanosina (ddI) -medicamentos para la diabetes -medicamentos para la inflamacin, tales como ibuprofeno,  naproxeno -metotrexato -multivitaminas -omeprazol -fenitona -probenecid -sucralfato -teofilina -warfarina Puede ser que esta lista no menciona todas las posibles interacciones. Informe a su profesional de [employer] de [employer] productos a base de hierbas, medicamentos de [place] o suplementos nutritivos que est tomando. Si usted fuma, consume bebidas alcohlicas o si utiliza drogas ilegales, indqueselo tambin a su profesional de [employer]. Algunas sustancias pueden interactuar con su medicamento. A qu debo estar atento al usar [employer]? Si los sntomas no mejoran, consulte con su mdico o con su profesional de [employer]. No trate la diarrea con productos de [employer]. Comunquese con su mdico si tiene diarrea que dura ms de 2 das o si es severa y [place]. Puede experimentar somnolencia o mareos. No conduzca ni utilice maquinaria, ni haga nada que Scientist, research (life sciences) en estado de alerta hasta que sepa cmo le afecta este medicamento. No se siente ni se ponga de pie con rapidez, especialmente si es un paciente de edad avanzada. Esto reduce el riesgo de mareos o [employer]. Este medicamento puede aumentar la sensibilidad al sol. Mantngase fuera de Secretary/administrator. Si no lo puede evitar, utilice ropa protectora y crema de Orthoptist. No utilice lmparas solares, camas solares ni cabinas solares. Evite las preparaciones de anticidos, aluminio, calcio, hierro, magnesio o cinc por lo menos 6 horas antes o 2 horas despus de Financial risk analyst. Qu efectos secundarios puedo tener al [employer] este medicamento? Efectos secundarios que debe informar a su mdico o a Producer, television/film/video de la salud tan pronto como sea posible: -reacciones alrgicas como erupcin cutnea, picazn o urticarias, hinchazn de la cara, labios o lengua -problemas respiratorios -confusin, pesadillas  o alucinaciones -sensacin de desmayos o aturdimiento, cadas -pulso cardiaco irregular -hinchazn o dolores  musculares, articulares o de tendones -dolor o dificultad para orinar -dolor de cabeza persistente con o sin visin borrosa -enrojecimiento, formacin de ampollas, descamacin o aflojamiento de la piel, inclusive dentro de la boca -convulsiones -dolor, entumecimiento, hormigueo o debilidad inusual Efectos secundarios que, por lo general, no requieren atencin mdica (debe informarlos a su mdico o a su profesional de la salud si persisten o si son molestos): -diarrea -nuseas o malestar estomacal -manchas blancas o llagas dentro de la boca Puede ser que esta lista no menciona todos los posibles efectos secundarios. Comunquese a su mdico por asesoramiento mdico Hewlett-Packardsobre los efectos secundarios. Usted puede informar los efectos secundarios a la FDA por telfono al 1-800-FDA-1088. Dnde debo guardar mi medicina? Mantngala fuera del alcance de los nios. Gurdela a Sanmina-SCItemperatura ambiente, a menos de 30 grados C (86 grados F). Mantenga el envase bien cerrado. Deseche todo el medicamento que no haya utilizado, despus de la fecha de vencimiento. ATENCIN: Este folleto es un resumen. Puede ser que no cubra toda la posible informacin. Si usted tiene preguntas acerca de esta medicina, consulte con su mdico, su farmacutico o su profesional de Radiographer, therapeuticla salud.    2016, Elsevier/Gold Standard. (2014-10-05 00:00:00)    Acyclovir tablets or capsules Qu es este medicamento? El ACICLOVIR es un medicamento antiviral. Se utiliza para tratar o prevenir infecciones provocadas por ciertos tipos de virus. Ejemplos de estas infecciones incluyen infecciones por herpes y [place]. Este medicamento no curar las infecciones de herpes. Este medicamento puede ser utilizado para otros usos; si tiene alguna pregunta consulte con su proveedor de atencin mdica o con su farmacutico. Qu le debo informar a mi profesional de la salud antes de tomar este medicamento? Necesita saber si usted presenta alguno de los  siguientes problemas o situaciones: -enfermedad renal -una reaccin alrgica o inusual al aciclovir, ganciclovir, valaciclovir, a otros medicamentos, alimentos, colorantes o conservantes -si est embarazada o buscando quedar embarazada -si est amamantando a un beb Cmo debo utilizar este medicamento? Tome este medicamento por va oral con un vaso de agua. Siga las instrucciones de la etiqueta del Robert Leemedicamento. Este medicamento se puede tomar con o sin alimentos. Tome sus dosis a intervalos regulares. No tome su medicamento con una frecuencia mayor a la indicada. Complete todo el tratamiento con el medicamento como indicado aun si se siente mejor. No omita ninguna dosis o suspenda el uso de su medicamento antes de lo indicado. Hable con su pediatra para informarse acerca del uso de este medicamento en nios. Aunque este medicamento ha sido recetado para condiciones selectivas, las precauciones se aplican. Sobredosis: Pngase en contacto inmediatamente con un centro toxicolgico o una sala de urgencia si usted cree que haya tomado demasiado medicamento. ATENCIN: Reynolds AmericanEste medicamento es solo para usted. No comparta este medicamento con nadie. Qu sucede si me olvido de una dosis? Si olvida una dosis, tmela lo antes posible. Si es casi la hora de la prxima dosis, tome slo esa dosis. No tome dosis adicionales o dobles. Qu puede interactuar con este medicamento? -probenecid Puede ser que esta lista no menciona todas las posibles interacciones. Informe a su profesional de Beazer Homesla salud de Ingram Micro Inctodos los productos a base de hierbas, medicamentos de Four Mile Roadventa libre o suplementos nutritivos que est tomando. Si usted fuma, consume bebidas alcohlicas o si utiliza drogas ilegales, indqueselo tambin a su profesional de Beazer Homesla salud. Algunas sustancias pueden interactuar con su medicamento. A qu debo estar atento  al usar [employer]? Si los sntomas no mejoran, informe a su mdico o a su profesional de [employer]. Este  medicamento es ms eficaz cuando se lo empieza a tomar en cuanto comienza la infeccin. Comience el tratamiento cuando presente los primeros signos de infeccin. Asegrese de beber 6 a 8 vasos de agua o lquidos mientras est tomando [employer]. Esto le ayudar a [employer]. Aun cuando est tomando [employer], podr transmitir el herpes, culebrilla o varicela a otra persona. Evite contacto con otros como indicado. El herpes genital es una enfermedad de transmisin sexual. Consulte con su mdico acerca de cmo evitar que su pareja se contagie. Qu efectos secundarios puedo tener al [employer] este medicamento? Efectos secundarios que debe informar a su mdico o a Producer, television/film/video de la salud tan pronto como sea posible: -Therapist, art como erupcin cutnea, picazn o urticarias, hinchazn de la cara, labios o lengua -dolor en el pecho -confusin, alucinaciones, temblores -orina de color amarillo oscuro -aumento de la sensibilidad al sol -enrojecimiento, formacin de ampollas, descamacin o distensin de la piel, inclusive dentro de la boca -convulsiones -dificultad para orinar o cambios en el volumen de orina -sangrado o magulladuras inusuales o puntos rojos en la piel -cansancio o debilidad inusual -color amarillento de los ojos o la piel Efectos secundarios que, por lo general, no requieren Psychologist, prison and probation services (debe informarlos a su mdico o a su profesional de la salud si persisten o si son molestos): -diarrea -fiebre -dolor de cabeza -nuseas, vmito -Programme researcher, broadcasting/film/video Puede ser que esta lista no menciona todos los posibles efectos secundarios. Comunquese a su mdico por asesoramiento mdico [employer]. Usted puede informar los efectos secundarios a la FDA por telfono al 1-800-FDA-1088. Dnde debo guardar mi medicina? Mantngala fuera del alcance de los nios. Gurdela a [employer], entre 15 y 25 grados C (22 y 76 grados  F). Deseche todo el medicamento que no haya utilizado, despus de la fecha de vencimiento. ATENCIN: Este folleto es un resumen. Puede ser que no cubra toda la posible informacin. Si usted tiene preguntas acerca de esta medicina, consulte con su mdico, su farmacutico o su profesional de Radiographer, therapeutic.    2016, Elsevier/Gold Standard. (2014-10-05 00:00:00)     IF you received an x-ray today, you will receive an invoice from [facility] Radiology. Please contact [facility] Radiology at [phone] with questions or concerns regarding your invoice.   IF you received labwork today, you will receive an invoice from [employer]. Please contact [facility] at [phone] with questions or concerns regarding your invoice.   Our billing staff will not be able to assist you with questions regarding bills from these companies.  You will be contacted with the lab results as soon as they are available. The fastest way to get your results is to activate your My Chart account. Instructions are located on the last page of this paperwork. If you have not heard from [place] regarding the results in 2 weeks, please contact this office.

## 2016-04-28 NOTE — Progress Notes (Signed)
MRN: [id number] DOB: April 26, 1981  Subjective:   Steven Farley is a 35 y.o. male presenting for follow up on dysuria.   Patient was last seen on 04/20/2016, was started on Flagyl and also treated with 1g azithromycin. Today, patient has no improvement in dysuria, urinary urgency, has sensation that he is having post-mic dribble, has difficulty sleeping at night due to his discomfort. Patient is sexually active with 1 male, used a condom for protection. Denies fever, n/v, abdominal pain, pelvic pain, genital rash, rashes.    Steven Farley has a current medication list which includes the following prescription(s): metronidazole. Also has No Known Allergies.  Steven Farley  has no past medical history on file. Also  has a past surgical history that includes Nasal sinus surgery and Fracture surgery.  Objective:   Vitals: BP 118/70   Pulse 81   Temp 98.3 F (36.8 C) (Oral)   Resp 18   Ht 5\' 9"  (1.753 m)   Wt 138 lb 6 oz (62.8 kg)   SpO2 99%   BMI 20.43 kg/m   Physical Exam  Constitutional: He is oriented to person, place, and time. He appears well-developed and well-nourished.  Cardiovascular: Normal rate.   Pulmonary/Chest: Effort normal.  Genitourinary: Testes normal and penis normal. Rectal exam shows no external hemorrhoid, no fissure, no mass and no tenderness. Prostate is tender. Prostate is not enlarged. Right testis shows no swelling and no tenderness. Left testis shows no swelling and no tenderness. No penile tenderness. No discharge found.  Lymphadenopathy: No inguinal adenopathy noted on the right or left side.  Neurological: He is alert and oriented to person, place, and time.   Results for orders placed or performed in visit on 04/28/16 (from the past 24 hour(s))  POCT urinalysis dipstick     Status: Abnormal   Collection Time: 04/28/16  3:12 PM  Result Value Ref Range   Color, UA yellow yellow   Clarity, UA clear clear   Glucose, UA negative negative   Bilirubin, UA  negative negative   Ketones, POC UA negative negative   Spec Grav, UA 1.020    Blood, UA trace-lysed (A) negative   pH, UA 5.5    Protein Ur, POC negative negative   Urobilinogen, UA 0.2    Nitrite, UA Negative Negative   Leukocytes, UA Trace (A) Negative  POCT CBC     Status: Abnormal   Collection Time: 04/28/16  3:15 PM  Result Value Ref Range   WBC 6.5 4.6 - 10.2 K/uL   Lymph, poc 1.5 0.6 - 3.4   POC LYMPH PERCENT 23.1 10 - 50 %L   MID (cbc) 0.3 0 - 0.9   POC MID % 4.9 0 - 12 %M   POC Granulocyte 4.7 2 - 6.9   Granulocyte percent 72.0 37 - 80 %G   RBC 4.96 4.69 - 6.13 M/uL   Hemoglobin 13.9 (A) 14.1 - 18.1 g/dL   HCT, POC 78.240.2 (A) 95.643.5 - 53.7 %   MCV 81.2 80 - 97 fL   MCH, POC 28.1 27 - 31.2 pg   MCHC 34.6 31.8 - 35.4 g/dL   RDW, POC 21.313.2 %   Platelet Count, POC 151 142 - 424 K/uL   MPV 9.3 0 - 99.8 fL  POCT Microscopic Urinalysis ([facility])     Status: Abnormal   Collection Time: 04/28/16  3:17 PM  Result Value Ref Range   WBC,UR,HPF,POC None None WBC/hpf   RBC,UR,HPF,POC Few (A) None RBC/hpf  Bacteria Few (A) None, Too numerous to count   Mucus Present (A) Absent   Epithelial Cells, UR Per Microscopy None None, Too numerous to count cells/hpf   Assessment and Plan :   1. Prostatitis, unspecified prostatitis type 2. Dysuria 3. Urinary frequency - Discussed differential with patient, he refused urine culture due to cost burden. He agreed to start ciprofloxacin to cover for prostatitis. If he has no improvement with this by Tuesday, he will start acyclovir TID to cover for urethral HSV infection. I discussed patient without urine culture and further lab tests we cannot hone in on the diagnosis, he verbalized understanding and will rtc in 5 days if his symptoms persist. He agreed to more testing at that point.  Wallis Bamberg, PA-C [facility] [phone] 04/28/2016 3:04 PM

## 2016-04-29 NOTE — Progress Notes (Signed)
Was any STD testing done?

## 2016-05-03 NOTE — Progress Notes (Signed)
Chelle tested him for STI's at his previous visit 2 weeks ago. She also treated him empirically with azithromycin and Flagyl. STI testing was negative.

## 2016-05-07 ENCOUNTER — Ambulatory Visit (INDEPENDENT_AMBULATORY_CARE_PROVIDER_SITE_OTHER): Payer: Self-pay | Admitting: Urgent Care

## 2016-05-07 VITALS — BP 118/76 | HR 90 | Temp 98.1°F | Resp 17 | Ht 69.0 in | Wt 138.0 lb

## 2016-05-07 DIAGNOSIS — N3943 Post-void dribbling: Secondary | ICD-10-CM

## 2016-05-07 DIAGNOSIS — N419 Inflammatory disease of prostate, unspecified: Secondary | ICD-10-CM

## 2016-05-07 DIAGNOSIS — R350 Frequency of micturition: Secondary | ICD-10-CM

## 2016-05-07 LAB — POCT URINALYSIS DIP (MANUAL ENTRY)
BILIRUBIN UA: NEGATIVE
Bilirubin, UA: NEGATIVE
Glucose, UA: NEGATIVE
Leukocytes, UA: NEGATIVE
Nitrite, UA: NEGATIVE
PH UA: 5
PROTEIN UA: NEGATIVE
UROBILINOGEN UA: 0.2

## 2016-05-07 LAB — POC MICROSCOPIC URINALYSIS (UMFC): MUCUS RE: ABSENT

## 2016-05-07 NOTE — Progress Notes (Signed)
    MRN: [id number] DOB: Jan 12, 1981  Subjective:   Steven Farley is a 35 y.o. male presenting for chief complaint of Follow-up (prostatitis )  Last follow up was 04/28/2016, was started on ciprofloxacin and acyclovir. This is his third visit here for the same symptom, has undergone treatment with Azithromycin, Flagyl. Review previous notes for more details. Today, he reports that he has had significant improvement in his dysuria but continues to have intermittent post-mic dribble. Denies fever, penile discharge, abdominal pain, pelvic pain, hematuria, urinary frequency, genital rashes. Denies straining to urinate, weakened stream, nocturia. His STI testing has been negative up to this point but he has not had HIV< RPR or urine culture performed due to cost burden.  Steven Farley has a current medication list which includes the following prescription(s): acyclovir and ciprofloxacin. Also has No Known Allergies.  Steven Farley  has no past medical history on file. Also  has a past surgical history that includes Nasal sinus surgery and Fracture surgery.  Objective:   Vitals: BP 118/76 (BP Location: Right Arm, Patient Position: Sitting, Cuff Size: Normal)   Pulse 90   Temp 98.1 F (36.7 C) (Oral)   Resp 17   Ht 5\' 9"  (1.753 m)   Wt 138 lb (62.6 kg)   SpO2 99%   BMI 20.38 kg/m   Physical Exam  Constitutional: He is oriented to person, place, and time. He appears well-developed and well-nourished.  Cardiovascular: Normal rate.   Pulmonary/Chest: Effort normal.  Neurological: He is alert and oriented to person, place, and time.   Results for orders placed or performed in visit on 05/07/16 (from the past 24 hour(s))  POCT urinalysis dipstick     Status: Abnormal   Collection Time: 05/07/16  5:21 PM  Result Value Ref Range   Color, UA yellow yellow   Clarity, UA clear clear   Glucose, UA negative negative   Bilirubin, UA negative negative   Ketones, POC UA negative negative   Spec Grav, UA  <=1.005    Blood, UA trace-lysed (A) negative   pH, UA 5.0    Protein Ur, POC negative negative   Urobilinogen, UA 0.2    Nitrite, UA Negative Negative   Leukocytes, UA Negative Negative  POCT Microscopic Urinalysis ([facility])     Status: None   Collection Time: 05/07/16  5:21 PM  Result Value Ref Range   WBC,UR,HPF,POC None None WBC/hpf   RBC,UR,HPF,POC None None RBC/hpf   Bacteria None None, Too numerous to count   Mucus Absent Absent   Epithelial Cells, UR Per Microscopy None None, Too numerous to count cells/hpf    Assessment and Plan :   1. Prostatitis, unspecified prostatitis type 2. Post-void dribbling 3. Urinary frequency - Patient will continue current regimen. At his last visit, his prostate exam was firm, smooth and without any appreciable enlargement. I discussed referring patient to urology but he preferred to obtain a urine culture. I advised patient that it may not help use given that he is already on treatment. However, patient feels this will be more cost-effective for him. I will discuss results with patient when they become available and if his symptoms persist, place referral to urology.  Wallis BambergMario Deontay Ladnier, PA-C [facility] [phone] 05/07/2016 4:41 PM

## 2016-05-07 NOTE — Patient Instructions (Addendum)
Prostatitis (Prostatitis) La prstata tiene aproximadamente el tamao y la forma de una nuez. Se ubica justo debajo de la vejiga. Produce uno de los componentes del semen, formado por los espermatozoides y los lquidos que ayudan a nutrirlos y transportarlos fuera de los testculos. La prostatitis es una inflamacin de la prstata.  Hay cuatro tipos de prostatitis:  Prostatitis bacteriana aguda. Es el tipo menos frecuente de prostatitis. Comienza rpidamente y, por lo general, est acompaada por una infeccin de la vejiga, fiebre alta y escalofros. Puede ocurrir a cualquier edad.  Prostatitis bacteriana crnica. Es una infeccin bacteriana persistente en la prstata. Generalmente aparece luego de una prostatitis bacteriana aguda que se repite o que no fue tratada adecuadamente. Se puede presentar en hombres de cualquier edad pero es ms comn en los hombres de mediana edad cuya prstata ha comenzado a agrandarse. Los sntomas no son tan graves como los de la prostatitis bacteriana aguda. La principal molestia puede ser en la parte del cuerpo que est delante del recto y debajo del escroto (perineo), en la parte baja del abdomen o en la punta del pene (glande).   Prostatitis crnica (no bacteriana). Es el tipo ms frecuente de prostatitis. Es la inflamacin de la prstata y no se origina por una infeccin bacteriana. Se desconoce su causa y puede estar asociada con infecciones virales o trastornos autoinmunitarios.  Prostatodinia (trastorno del suelo plvico). Est asociada con un aumento del tono muscular en la pelvis que rodea a la prstata. CAUSAS La causa de la prostatitis bacteriana es una infeccin bacteriana. Se desconocen las causas de los otros tipos de prostatitis.  SNTOMAS  Los sntomas pueden variar segn el tipo de prostatitis. Tambin puede ocurrir que coincidan sntomas de diferentes tipos de prostatitis. Los sntomas posibles de cada tipo de prostatitis se enumeran a  continuacin. Prostatitis bacteriana aguda  Dolor al orinar.  Fiebre o escalofros.  Dolor muscular o en las articulaciones.  Dolor lumbar.  Dolor en la zona inferior del abdomen.  Imposibilidad de vaciar la vejiga completamente. Prostatitis bacteriana crnica, prostatitis crnica no bacteriana y prostatodinia  Necesidad urgente y repentina de orinar.  Ganas de orinar con frecuencia.  Dificultad para comenzar a eliminar la orina.  Chorro de orina dbil.  Secrecin por la uretra.  Goteo al terminar de orinar.  Dolor rectal.  Dolor en los testculos, el pene o la punta del pene.  Dolor en el perineo.  Problemas con la funcin sexual.  Eyaculacin dolorosa.  Semen con sangre. DIAGNSTICO  Su mdico le preguntar acerca de sus sntomas para hacer el diagnstico de prostatitis. Se recolectarn y analizarn una o ms muestras de orina (anlisis de orina). Si el resultado del anlisis de orina es negativo para bacterias, su mdico podr palpar su prstata con un dedo (examen dgito rectal). Este examen ayuda a su mdico a determinar si la prstata est inflamada y sensible. Tambin se obtendr una muestra de semen para analizarla. TRATAMIENTO  El tratamiento de la prostatitis depende de la causa. Si la causa es una infeccin bacteriana, se puede tratar con antibiticos. En los casos de prostatitis bacteriana crnica, puede ser necesario el uso de antibiticos durante hasta 1mes o 6semanas. Su mdico puede indicarle que tome baos de asiento para ayudar a aliviar el dolor. Un bao de asiento es un bao de agua caliente en la que las caderas y las nalgas estn bajo el agua. Esto relaja los msculos del suelo plvico y, a menudo, contribuye a aliviar la presin en la prstata.   INSTRUCCIONES PARA EL CUIDADO EN EL HOGAR   Tome todos los medicamentos como le indic el mdico.  Tome baos de asiento segn las indicaciones del mdico. SOLICITE ATENCIN MDICA SI:   Los sntomas  empeoran en lugar de mejorar.  Tiene fiebre. SOLICITE ATENCIN MDICA DE INMEDIATO SI:   Tiene escalofros.  Siente nuseas o vomita.  Se siente mareado o se desmaya.  No puede orinar.  Tiene sangre o cogulos de sangre en la orina. ASEGRESE DE QUE:  Comprende estas instrucciones.  Controlar su afeccin.  Recibir ayuda de inmediato si no mejora o si empeora.   Esta informacin no tiene Theme park managercomo fin reemplazar el consejo del mdico. Asegrese de hacerle al mdico cualquier pregunta que tenga.   Document Released: 05/23/2005 Document Revised: 09/03/2014 Elsevier Interactive Patient Education [employer]2016 Elsevier Inc.     IF you received an x-ray today, you will receive an invoice from [facility]. Please contact [facility] at [phone] with questions or concerns regarding your invoice.   IF you received labwork today, you will receive an invoice from [facility]. Please contact [facility] at [phone] with questions or concerns regarding your invoice.   Our billing staff will not be able to assist you with questions regarding bills from these companies.  You will be contacted with the lab results as soon as they are available. The fastest way to get your results is to activate your My Chart account. Instructions are located on the last page of this paperwork. If you have not heard from us regarding the results in 2 weeks, please contact this office.

## 2016-05-08 LAB — URINE CULTURE: ORGANISM ID, BACTERIA: NO GROWTH

## 2016-05-11 ENCOUNTER — Telehealth: Payer: Self-pay

## 2016-05-11 ENCOUNTER — Encounter: Payer: Self-pay | Admitting: Urgent Care

## 2016-05-11 NOTE — Telephone Encounter (Signed)
Patient would like a call back around 12 if possible in regards to labs [phone]

## 2016-05-11 NOTE — Telephone Encounter (Signed)
Spoke with pt. Labs not released. Stated he would call back next week.

## 2016-10-24 DIAGNOSIS — M25559 Pain in unspecified hip: Secondary | ICD-10-CM

## 2017-03-22 ENCOUNTER — Other Ambulatory Visit: Payer: Self-pay | Admitting: Family Medicine

## 2017-03-22 ENCOUNTER — Ambulatory Visit
Admission: RE | Admit: 2017-03-22 | Discharge: 2017-03-22 | Disposition: A | Discharge status: Home / Self Care | Payer: Worker's Compensation | Source: Ambulatory Visit | Attending: Family Medicine | Admitting: Family Medicine

## 2017-03-22 DIAGNOSIS — T1490XA Injury, unspecified, initial encounter: Secondary | ICD-10-CM

## 2017-03-27 ENCOUNTER — Encounter (HOSPITAL_COMMUNITY): Payer: Self-pay | Admitting: Emergency Medicine

## 2017-03-27 ENCOUNTER — Ambulatory Visit (HOSPITAL_COMMUNITY)
Admission: EM | Admit: 2017-03-27 | Discharge: 2017-03-27 | Disposition: A | Discharge status: Home / Self Care | Payer: Self-pay | Attending: Family Medicine | Admitting: Family Medicine

## 2017-03-27 DIAGNOSIS — R350 Frequency of micturition: Secondary | ICD-10-CM

## 2017-03-27 DIAGNOSIS — R319 Hematuria, unspecified: Secondary | ICD-10-CM

## 2017-03-27 LAB — POCT URINALYSIS DIP (DEVICE)
BILIRUBIN URINE: NEGATIVE
GLUCOSE, UA: NEGATIVE mg/dL
Ketones, ur: NEGATIVE mg/dL
Leukocytes, UA: NEGATIVE
NITRITE: NEGATIVE
PH: 7 (ref 5.0–8.0)
PROTEIN: NEGATIVE mg/dL
Specific Gravity, Urine: 1.015 (ref 1.005–1.030)
UROBILINOGEN UA: 0.2 mg/dL (ref 0.0–1.0)

## 2017-03-27 MED ORDER — SULFAMETHOXAZOLE-TRIMETHOPRIM 800-160 MG PO TABS: 1.0000 | ORAL_TABLET | Freq: Two times a day (BID) | ORAL | 0 refills | Status: AC

## 2017-03-27 NOTE — ED Triage Notes (Signed)
PT also reports he has herpes

## 2017-03-27 NOTE — ED Triage Notes (Signed)
PT reports dysuria that started today. No fever, blood in urine, abdominal pain.

## 2017-03-28 NOTE — ED Provider Notes (Signed)
  [facility]   [id number] 03/27/17 Arrival Time: 1644  ASSESSMENT & PLAN:  1. Urinary frequency   2. Hematuria, unspecified type     Meds ordered this encounter  Medications  . sulfamethoxazole-trimethoprim (BACTRIM DS,SEPTRA DS) 800-160 MG tablet    Sig: Take 1 tablet by mouth 2 (two) times daily.    Dispense:  14 tablet    Refill:  0   Urine culture not sent secondary to cost burden. If not showing significant improvement within 48 hours he will return.  Reviewed expectations re: course of current medical issues. Questions answered. Outlined signs and symptoms indicating need for more acute intervention. Patient verbalized understanding. After Visit Summary given.   SUBJECTIVE:  Steven Farley is a 36 y.o. male who presents with complaint of urinary frequency and mild dysuria first noticed today. UTI in the past and reports this feels similar. Afebrile. No abdominal or flank pain. No n/v. Normal PO intake. No specific aggravating or alleviating factors reported. No OTC treatment.  ROS: As per HPI.   OBJECTIVE:  Vitals:   03/27/17 1726 03/27/17 1728  BP:  137/83  Pulse:  79  Resp:  16  Temp:  98.7 F (37.1 C)  TempSrc:  Oral  SpO2:  97%  Weight: 145 lb (65.8 kg)      General appearance: alert; no distress Abdomen: soft, non-tender; bowel sounds normal; no masses or organomegaly; no guarding or rebound tenderness Back: no CVA tenderness Extremities: no cyanosis or edema; symmetrical with no gross deformities Skin: warm and dry   Results for orders placed or performed during the hospital encounter of 03/27/17  POCT urinalysis dip (device)  Result Value Ref Range   Glucose, UA NEGATIVE NEGATIVE mg/dL   Bilirubin Urine NEGATIVE NEGATIVE   Ketones, ur NEGATIVE NEGATIVE mg/dL   Specific Gravity, Urine 1.015 1.005 - 1.030   Hgb urine dipstick TRACE (A) NEGATIVE   pH 7.0 5.0 - 8.0   Protein, ur NEGATIVE NEGATIVE mg/dL   Urobilinogen, UA 0.2 0.0 -  1.0 mg/dL   Nitrite NEGATIVE NEGATIVE   Leukocytes, UA NEGATIVE NEGATIVE    Labs Reviewed  POCT URINALYSIS DIP (DEVICE) - Abnormal; Notable for the following:       Result Value   Hgb urine dipstick TRACE (*)    All other components within normal limits    PMHx, SurgHx, SocialHx, Medications, and Allergies were reviewed in the Visit Navigator and updated as appropriate.      Mardella LaymanHagler, Shandee Jergens, MD 03/28/17 1209

## 2017-04-12 ENCOUNTER — Encounter (HOSPITAL_COMMUNITY): Payer: Self-pay | Admitting: Emergency Medicine

## 2017-04-12 ENCOUNTER — Ambulatory Visit (HOSPITAL_COMMUNITY)
Admission: EM | Admit: 2017-04-12 | Discharge: 2017-04-12 | Disposition: A | Discharge status: Home / Self Care | Payer: Self-pay | Attending: Family Medicine | Admitting: Family Medicine

## 2017-04-12 DIAGNOSIS — Z8249 Family history of ischemic heart disease and other diseases of the circulatory system: Secondary | ICD-10-CM | POA: Insufficient documentation

## 2017-04-12 DIAGNOSIS — R300 Dysuria: Secondary | ICD-10-CM

## 2017-04-12 LAB — POCT URINALYSIS DIP (DEVICE)
BILIRUBIN URINE: NEGATIVE
GLUCOSE, UA: NEGATIVE mg/dL
KETONES UR: NEGATIVE mg/dL
LEUKOCYTES UA: NEGATIVE
Nitrite: NEGATIVE
Protein, ur: NEGATIVE mg/dL
SPECIFIC GRAVITY, URINE: 1.015 (ref 1.005–1.030)
Urobilinogen, UA: 0.2 mg/dL (ref 0.0–1.0)
pH: 6.5 (ref 5.0–8.0)

## 2017-04-12 MED ORDER — DOXYCYCLINE HYCLATE 100 MG PO TABS: 100.0000 mg | ORAL_TABLET | Freq: Two times a day (BID) | ORAL | 0 refills | Status: DC

## 2017-04-12 NOTE — Discharge Instructions (Signed)
Vamos a hacer un otra prueba para ver cual tipo de infecion tiene.    La prueba que hicimos hoy aqui es normal.  Favor de empezar un antibiotico nuevo que vamos a Camera operator y Corporate treasurer a Garment/textile technologist

## 2017-04-12 NOTE — ED Triage Notes (Signed)
Pt c/o UTI sx onset yest associated w/dysuria, urinary freq, urinary incontinence   Reports hx of UTI  Sexually active in a new relationship x3 months.... Does not use condoms  A&O x4... NAD... Ambulatory

## 2017-04-12 NOTE — ED Provider Notes (Signed)
[facility]    CSN: [id number] Arrival date & time: 04/12/17  1644     History   Chief Complaint Chief Complaint  Patient presents with  . Urinary Tract Infection    HPI Steven Farley is a 36 y.o. male.   This is a 36 year old Hispanic man comes in with complaints of dysuria. He was seen 2 weeks ago and started on Septra. His urine at that time did not show any infection. He was told to come back the symptoms continued.  He's been with the same partner for over 3 months. After the initial antibiotic but they came back 2 days ago. He's wondering if he has a contagious disease.      History reviewed. No pertinent past medical history.  Patient Active Problem List   Diagnosis Date Noted  . Pain in joint, pelvic region and thigh 10/24/2016    Past Surgical History:  Procedure Laterality Date  . FRACTURE SURGERY     Nose (1991)  . NASAL SINUS SURGERY         Home Medications    Prior to Admission medications   Medication Sig Start Date End Date Taking? Authorizing Provider  doxycycline (VIBRA-TABS) 100 MG tablet Take 1 tablet (100 mg total) by mouth 2 (two) times daily. 04/12/17   Elvina Sidle, MD    Family History Family History  Problem Relation Age of Onset  . Hypertension Brother     Social History Social History  Substance Use Topics  . Smoking status: Never Smoker  . Smokeless tobacco: Never Used  . Alcohol use Yes     Comment: occassionally     Allergies   Patient has no known allergies.   Review of Systems Review of Systems   Physical Exam Triage Vital Signs ED Triage Vitals  Enc Vitals Group     BP      Pulse      Resp      Temp      Temp src      SpO2      Weight      Height      Head Circumference      Peak Flow      Pain Score      Pain Loc      Pain Edu?      Excl. in GC?    No data found.   Updated Vital Signs BP (!) 147/90 (BP Location: Left Arm)   Pulse 71   Temp 98.6 F (37 C) (Oral)    Resp 18   SpO2 99%    Physical Exam   UC Treatments / Results  Labs (all labs ordered are listed, but only abnormal results are displayed) Labs Reviewed  URINE CYTOLOGY ANCILLARY ONLY    EKG  EKG Interpretation None       Radiology No results found.  Procedures Procedures (including critical care time)  Medications Ordered in UC Medications - No data to display   Initial Impression / Assessment and Plan / UC Course  I have reviewed the triage vital signs and the nursing notes.  Pertinent labs & imaging results that were available during my care of the patient were reviewed by me and considered in my medical decision making (see chart for details).      Final Clinical Impressions(s) / UC Diagnoses   Final diagnoses:  Dysuria    New Prescriptions New Prescriptions   DOXYCYCLINE (VIBRA-TABS) 100 MG TABLET    Take  1 tablet (100 mg total) by mouth 2 (two) times daily.     Controlled Substance Prescriptions [place] Controlled Substance Registry consulted? Not Applicable   Elvina Sidle, MD 04/12/17 [phone]

## 2017-04-15 LAB — URINE CYTOLOGY ANCILLARY ONLY
Chlamydia: NEGATIVE
Neisseria Gonorrhea: NEGATIVE
Trichomonas: NEGATIVE

## 2018-11-01 ENCOUNTER — Encounter (HOSPITAL_COMMUNITY): Payer: Self-pay | Admitting: Emergency Medicine

## 2018-11-01 ENCOUNTER — Emergency Department (HOSPITAL_COMMUNITY)
Admission: EM | Admit: 2018-11-01 | Discharge: 2018-11-01 | Disposition: A | Discharge status: Home / Self Care | Payer: Self-pay | Attending: Emergency Medicine | Admitting: Emergency Medicine

## 2018-11-01 ENCOUNTER — Emergency Department (HOSPITAL_COMMUNITY): Payer: Self-pay

## 2018-11-01 ENCOUNTER — Other Ambulatory Visit: Payer: Self-pay

## 2018-11-01 DIAGNOSIS — R112 Nausea with vomiting, unspecified: Secondary | ICD-10-CM | POA: Insufficient documentation

## 2018-11-01 DIAGNOSIS — R072 Precordial pain: Secondary | ICD-10-CM | POA: Insufficient documentation

## 2018-11-01 LAB — COMPREHENSIVE METABOLIC PANEL
ALBUMIN: 4.3 g/dL (ref 3.5–5.0)
ALT: 21 U/L (ref 0–44)
ANION GAP: 13 (ref 5–15)
AST: 18 U/L (ref 15–41)
Alkaline Phosphatase: 54 U/L (ref 38–126)
BILIRUBIN TOTAL: 0.7 mg/dL (ref 0.3–1.2)
BUN: 17 mg/dL (ref 6–20)
CALCIUM: 9.4 mg/dL (ref 8.9–10.3)
CO2: 23 mmol/L (ref 22–32)
Chloride: 100 mmol/L (ref 98–111)
Creatinine, Ser: 0.8 mg/dL (ref 0.61–1.24)
GFR calc Af Amer: >60 mL/min (ref >60)
GLUCOSE: 196 mg/dL — AB (ref 70–99)
POTASSIUM: 4 mmol/L (ref 3.5–5.1)
Sodium: 136 mmol/L (ref 135–145)
TOTAL PROTEIN: 7.1 g/dL (ref 6.5–8.1)

## 2018-11-01 LAB — I-STAT TROPONIN, ED
TROPONIN I, POC: 0 ng/mL (ref 0.00–0.08)
Troponin i, poc: 0.01 ng/mL (ref 0.00–0.08)

## 2018-11-01 LAB — CBC
HCT: 45.3 % (ref 39.0–52.0)
Hemoglobin: 13.9 g/dL (ref 13.0–17.0)
MCH: 26.5 pg (ref 26.0–34.0)
MCHC: 30.7 g/dL (ref 30.0–36.0)
MCV: 86.5 fL (ref 80.0–100.0)
PLATELETS: 211 10*3/uL (ref 150–400)
RBC: 5.24 MIL/uL (ref 4.22–5.81)
RDW: 12.7 % (ref 11.5–15.5)
WBC: 12.6 10*3/uL — ABNORMAL HIGH (ref 4.0–10.5)
nRBC: 0 % (ref 0.0–0.2)

## 2018-11-01 LAB — LIPASE, BLOOD: Lipase: 34 U/L (ref 11–51)

## 2018-11-01 MED ORDER — ONDANSETRON 4 MG PO TBDP
4.0000 mg | ORAL_TABLET | Freq: Once | ORAL | Status: AC
  Administered 2018-11-01: 4 mg via ORAL
  Filled 2018-11-01: qty 1

## 2018-11-01 MED ORDER — ALUM & MAG HYDROXIDE-SIMETH 200-200-20 MG/5ML PO SUSP
15.0000 mL | Freq: Once | ORAL | Status: AC
  Administered 2018-11-01: 15 mL via ORAL
  Filled 2018-11-01: qty 30

## 2018-11-01 MED ORDER — ONDANSETRON 4 MG PO TBDP: 4.0000 mg | ORAL_TABLET | Freq: Three times a day (TID) | ORAL | 0 refills | Status: DC | PRN

## 2018-11-01 MED ORDER — DICYCLOMINE HCL 20 MG PO TABS: 20.0000 mg | ORAL_TABLET | Freq: Three times a day (TID) | ORAL | 0 refills | Status: AC | PRN

## 2018-11-01 MED ORDER — ACETAMINOPHEN 500 MG PO TABS
1000.0000 mg | ORAL_TABLET | Freq: Once | ORAL | Status: AC
  Administered 2018-11-01: 1000 mg via ORAL
  Filled 2018-11-01: qty 2

## 2018-11-01 MED ORDER — SODIUM CHLORIDE 0.9% FLUSH: 3.0000 mL | Freq: Once | INTRAVENOUS | Status: DC

## 2018-11-01 NOTE — ED Provider Notes (Signed)
Emergency Department Provider Note   I have reviewed the triage vital signs and the nursing notes.   HISTORY  Chief Complaint Chest Pain   HPI Steven Farley is a 38 y.o. male with no significant past medical history presents to the emergency department with acute onset nausea, vomiting with resulting chest discomfort.  Symptoms began early this morning and woke the patient from sleep.  He initially had multiple episodes of forceful emesis.  He developed chest tightness/soreness shortly afterwards.  He noticed some streaks of bright red blood in the emesis but no coffee-ground material.  He states that the streaks were very small and faint.  No fevers or chills.  He is not experiencing abdominal pain.  His chest pain symptoms have almost resolved.  No sick contacts.  Denies shortness of breath.  No radiation of symptoms or other modifying factors.  History reviewed. No pertinent past medical history.  Patient Active Problem List   Diagnosis Date Noted  . Pain in joint, pelvic region and thigh 10/24/2016    Past Surgical History:  Procedure Laterality Date  . FRACTURE SURGERY     Nose (1991)  . NASAL SINUS SURGERY      Allergies Patient has no known allergies.  Family History  Problem Relation Age of Onset  . Hypertension Brother     Social History Social History   Tobacco Use  . Smoking status: Never Smoker  . Smokeless tobacco: Never Used  Substance Use Topics  . Alcohol use: Yes    Comment: Socially   . Drug use: No    Review of Systems  Constitutional: No fever/chills Eyes: No visual changes. ENT: No sore throat. Cardiovascular: Positive chest pain. Respiratory: Denies shortness of breath. Gastrointestinal: No abdominal pain. Positive nausea and vomiting.  No diarrhea.  No constipation. Genitourinary: Negative for dysuria. Musculoskeletal: Negative for back pain. Skin: Negative for rash. Neurological: Negative for headaches, focal weakness or  numbness.  10-point ROS otherwise negative.  ____________________________________________   PHYSICAL EXAM:  VITAL SIGNS: ED Triage Vitals  Enc Vitals Group     BP 11/01/18 0532 126/82     Pulse Rate 11/01/18 0532 81     Resp 11/01/18 0532 (!) 22     Temp 11/01/18 0532 97.9 F (36.6 C)     Temp Source 11/01/18 0532 Oral     SpO2 11/01/18 0532 100 %     Weight 11/01/18 0531 155 lb (70.3 kg)     Height 11/01/18 0531 5\' 9"  (1.753 m)     Pain Score 11/01/18 0531 9   Constitutional: Alert and oriented. Well appearing and in no acute distress. Eyes: Conjunctivae are normal.  Head: Atraumatic. Nose: No congestion/rhinnorhea. Mouth/Throat: Mucous membranes are moist.  Neck: No stridor.   Cardiovascular: Normal rate, regular rhythm. Good peripheral circulation. Grossly normal heart sounds.   Respiratory: Normal respiratory effort.  No retractions. Lungs CTAB. Gastrointestinal: Soft and nontender. No distention.  Musculoskeletal: No lower extremity tenderness nor edema. No gross deformities of extremities. Neurologic:  Normal speech and language. No gross focal neurologic deficits are appreciated.  Skin:  Skin is warm, dry and intact. No rash noted.  ____________________________________________   LABS (all labs ordered are listed, but only abnormal results are displayed)  Labs Reviewed  CBC - Abnormal; Notable for the following components:      Result Value   WBC 12.6 (*)    All other components within normal limits  COMPREHENSIVE METABOLIC PANEL - Abnormal; Notable for the  following components:   Glucose, Bld 196 (*)    All other components within normal limits  LIPASE, BLOOD  I-STAT TROPONIN, ED  I-STAT TROPONIN, ED   ____________________________________________  EKG   EKG Interpretation  Date/Time:  Saturday November 01 2018 05:31:49 EST Ventricular Rate:  79 PR Interval:  112 QRS Duration: 94 QT Interval:  364 QTC Calculation: 417 R Axis:   51 Text  Interpretation:  Normal sinus rhythm Normal ECG No STEMI.  Confirmed by Alona Bene [phone] on 11/01/2018 7:07:20 AM       ____________________________________________  RADIOLOGY  Dg Chest 2 View  Result Date: 11/01/2018 CLINICAL DATA:  Nausea and vomiting with chest tightness EXAM: CHEST - 2 VIEW COMPARISON:  11/04/2006 FINDINGS: The heart size and mediastinal contours are within normal limits. Both lungs are clear. The visualized skeletal structures are unremarkable. IMPRESSION: No active cardiopulmonary disease. Electronically Signed   By: Alcide Clever M.D.   On: 11/01/2018 06:14    ____________________________________________   PROCEDURES  Procedure(s) performed:   Procedures  None  ____________________________________________   INITIAL IMPRESSION / ASSESSMENT AND PLAN / ED COURSE  Pertinent labs & imaging results that were available during my care of the patient were reviewed by me and considered in my medical decision making (see chart for details).  Patient presents the emergency department for evaluation of chest pain which began after several episodes of vomiting.  Patient reports trace amounts of blood in the emesis.  No hemodynamic instability, shortness of breath, hypoxemia.  Chest x-ray ordered from triage which shows negative troponin, largely unremarkable lab work and unremarkable chest x-ray.  Specifically no mediastinal air or infiltrate.  LFTs and bilirubin are normal.  Patient has a mild leukocytosis likely related to viral infection.  I do plan for repeat troponin at the 3-hour mark but my suspicion for acute coronary syndrome is exceedingly low.  Patient is low risk by HEART score. PERC negative.   09:20 AM  Repeat troponin is negative.  Patient feeling somewhat improved after medications here.  Vital signs are normal.  Suspect viral infection.  Called in medications for abdominal cramping and vomiting.  Discussed establishing care with a PCP and provided contact  information for calling today.  Discussed ED return precautions in detail. ____________________________________________  FINAL CLINICAL IMPRESSION(S) / ED DIAGNOSES  Final diagnoses:  Precordial chest pain  Non-intractable vomiting with nausea, unspecified vomiting type     MEDICATIONS GIVEN DURING THIS VISIT:  Medications  sodium chloride flush (NS) 0.9 % injection 3 mL (3 mLs Intravenous Not Given 11/01/18 0727)  alum & mag hydroxide-simeth (MAALOX/MYLANTA) 200-200-20 MG/5ML suspension 15 mL (15 mLs Oral Given 11/01/18 0725)  ondansetron (ZOFRAN-ODT) disintegrating tablet 4 mg (4 mg Oral Given 11/01/18 0726)  acetaminophen (TYLENOL) tablet 1,000 mg (1,000 mg Oral Given 11/01/18 0725)     NEW OUTPATIENT MEDICATIONS STARTED DURING THIS VISIT:  New Prescriptions   DICYCLOMINE (BENTYL) 20 MG TABLET    Take 1 tablet (20 mg total) by mouth 3 (three) times daily as needed for spasms (abdominal cramping).   ONDANSETRON (ZOFRAN ODT) 4 MG DISINTEGRATING TABLET    Take 1 tablet (4 mg total) by mouth every 8 (eight) hours as needed for nausea or vomiting.    Note:  This document was prepared using Dragon voice recognition software and may include unintentional dictation errors.  Alona Bene, MD Emergency Medicine    Markez Dowland, Arlyss Repress, MD 11/01/18 [phone]

## 2018-11-01 NOTE — ED Notes (Signed)
Patient verbalizes understanding of discharge instructions . Opportunity for questions and answers were provided . Armband removed by staff ,Pt discharged from ED. W/C  offered at D/C  and Declined W/C at D/C and was escorted to lobby by RN.  

## 2018-11-01 NOTE — ED Triage Notes (Signed)
Pt in POV reporting sudden onset of N/V, 5 episodes of emesis. Then developed chest tightness after. No cardiac hx. Appears to be in discomfort.

## 2018-11-01 NOTE — Discharge Instructions (Signed)

## 2019-08-16 IMAGING — CR DG CHEST 2V
2 series · 2 of 2 positions shown · non-contrast
Comparison: 11/04/2006

CLINICAL DATA: Nausea and vomiting with chest tightness

EXAM:
CHEST - 2 VIEW

[chest pa]
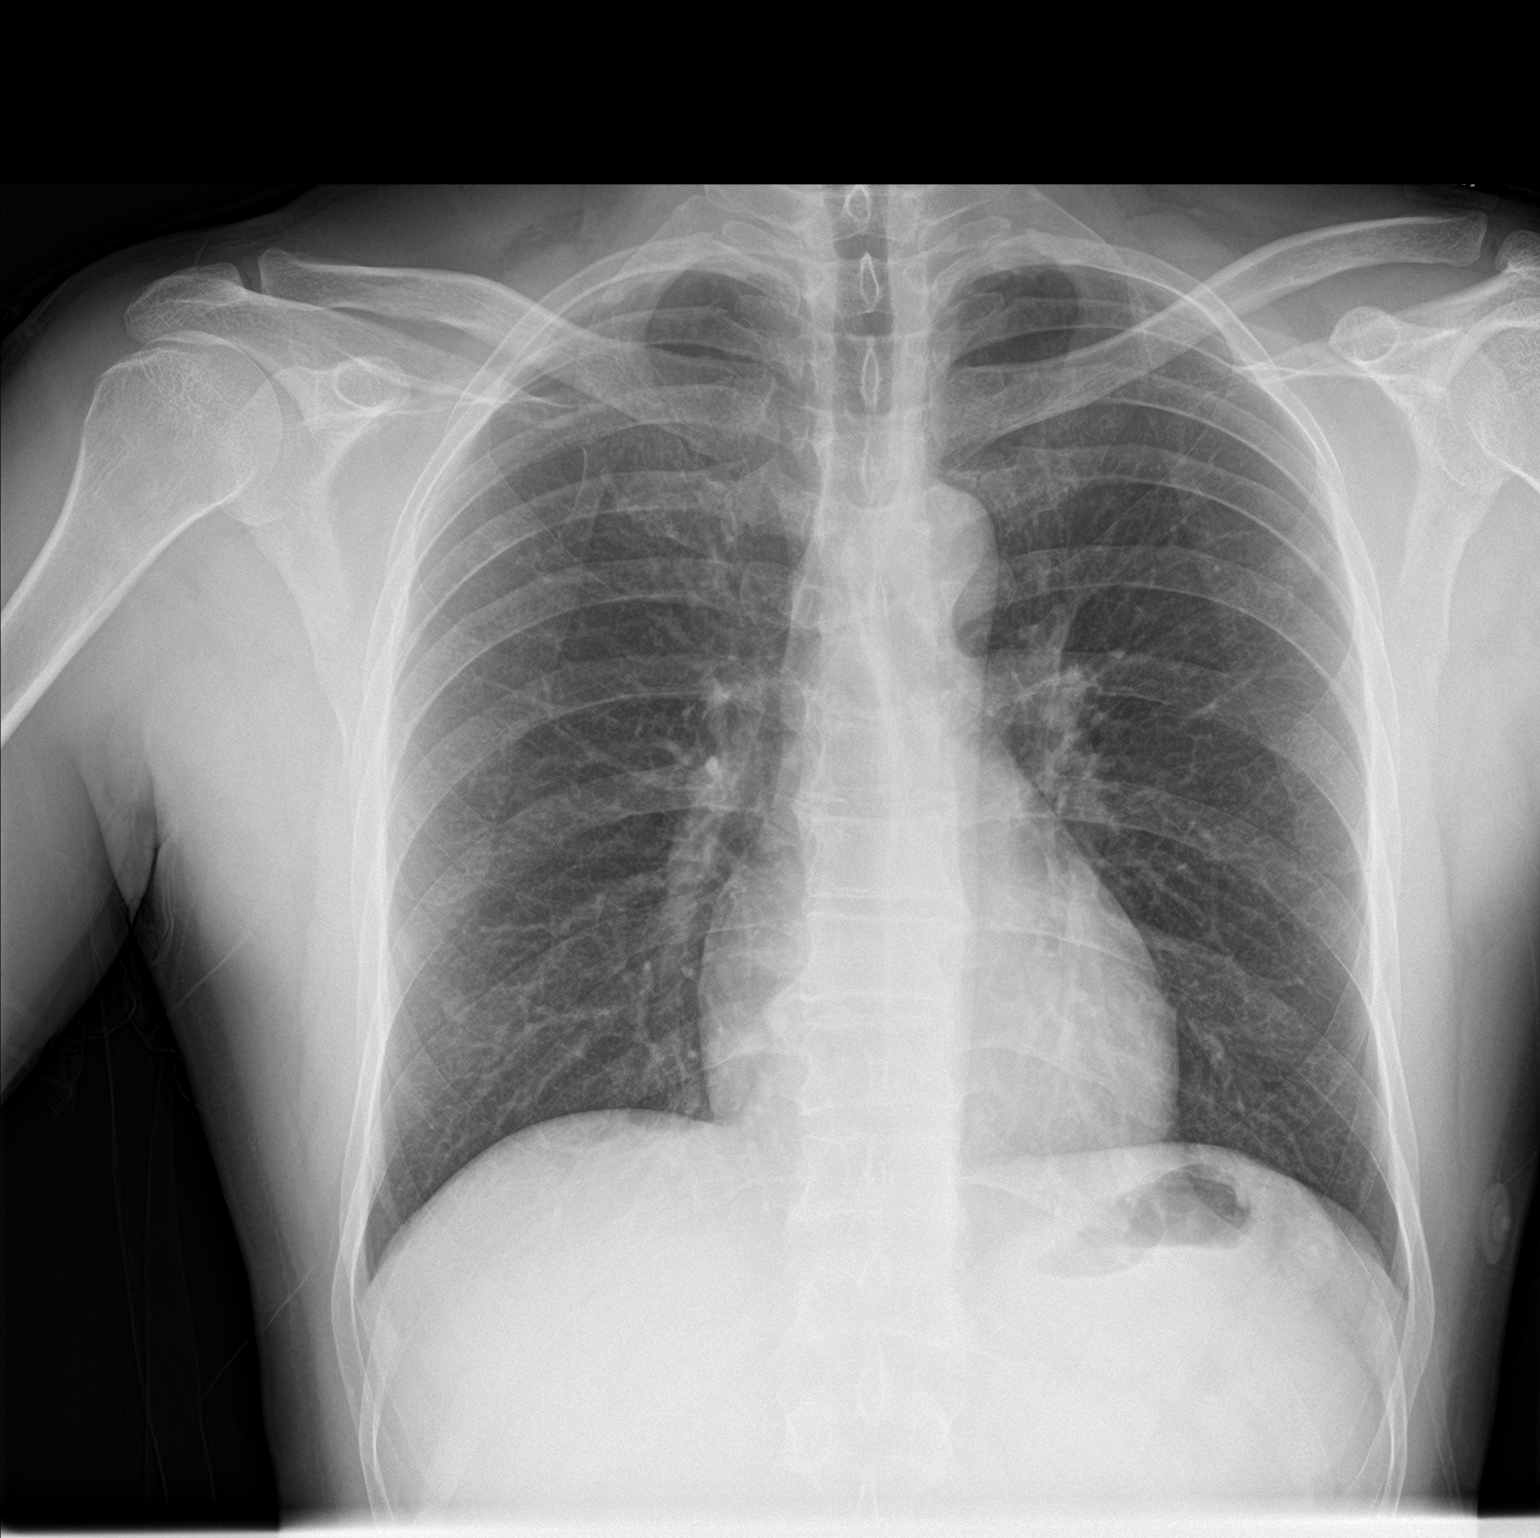

[chest lat]
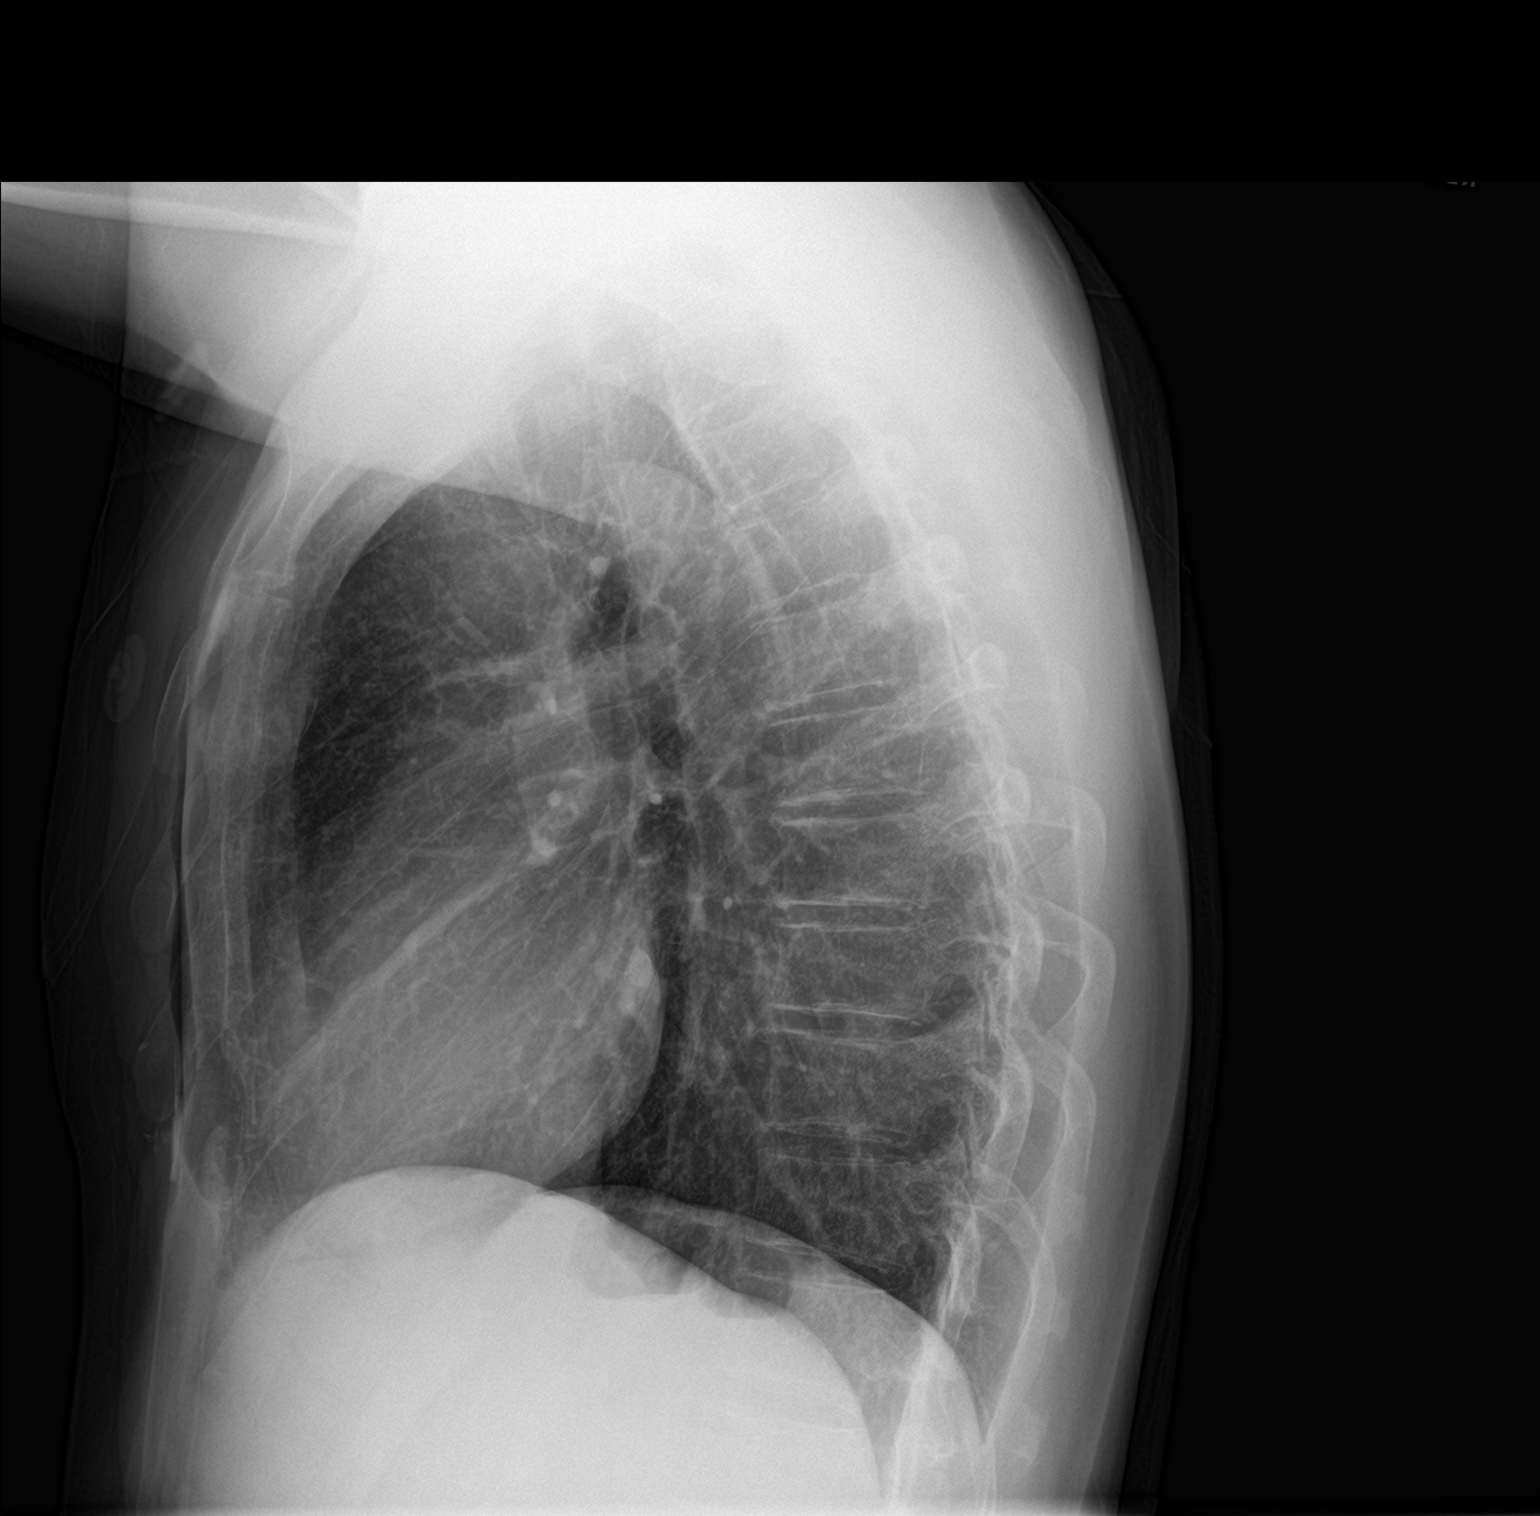

[2 of 2 positions shown; findings below may reference images not displayed]

FINDINGS: The heart size and mediastinal contours are within normal limits.
Both lungs are clear. The visualized skeletal structures are
unremarkable.
IMPRESSION: No active cardiopulmonary disease.

## 2021-06-07 ENCOUNTER — Encounter (HOSPITAL_COMMUNITY): Payer: Self-pay

## 2021-06-07 ENCOUNTER — Other Ambulatory Visit: Payer: Self-pay

## 2021-06-07 ENCOUNTER — Ambulatory Visit (INDEPENDENT_AMBULATORY_CARE_PROVIDER_SITE_OTHER): Payer: Self-pay

## 2021-06-07 ENCOUNTER — Ambulatory Visit (HOSPITAL_COMMUNITY)
Admission: EM | Admit: 2021-06-07 | Discharge: 2021-06-07 | Disposition: A | Discharge status: Home / Self Care | Payer: Self-pay | Attending: Internal Medicine | Admitting: Internal Medicine

## 2021-06-07 DIAGNOSIS — R0782 Intercostal pain: Secondary | ICD-10-CM

## 2021-06-07 DIAGNOSIS — R0781 Pleurodynia: Secondary | ICD-10-CM

## 2021-06-07 NOTE — Discharge Instructions (Addendum)
Your x-rays did not show any fractures in your ribs.  You have bruised your ribs, which can be painful, but should improve over the next week.  You can take ibuprofen or tylenol as needed for your pain and ice the area 15 min at a time, 3 times a day.  If you have a change or worsening in your pain, especially if it is more on the left side of your chest, or if you have difficulty breathing, you should be seen by a medical provider right away.

## 2021-06-07 NOTE — ED Triage Notes (Signed)
Pt presents with R side rib pain. Pt states he had a fall on Monday.

## 2021-06-07 NOTE — ED Provider Notes (Signed)
[facility]    CSN: [id number] Arrival date & time: 06/07/21  0809      History   Chief Complaint Chief Complaint  Patient presents with   Fall   Chest Pain    RIB     HPI Steven Farley is a 40 y.o. male.   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 No pertinent past medical history.  Patient Active Problem List   Diagnosis Date Noted   Pain in joint, pelvic region and thigh 10/24/2016    Past Surgical History:  Procedure Laterality Date   FRACTURE SURGERY     Nose (1991)   NASAL SINUS SURGERY         Home Medications    Prior to Admission medications   Medication Sig Start Date End Date Taking? Authorizing Provider  acetaminophen (TYLENOL) 650 MG CR tablet Take 1,300 mg by mouth every 8 (eight) hours as needed for pain.    [provider]  Artificial Tear Ointment (DRY EYES OP) Apply 1-2 drops to eye 3 (three) times daily as needed (for dry eyes).    [provider]  dicyclomine (BENTYL) 20 MG tablet Take 1 tablet (20 mg total) by mouth 3 (three) times daily as needed for spasms (abdominal cramping). 11/01/18   Long, Arlyss Repress, MD  ondansetron (ZOFRAN ODT) 4 MG disintegrating tablet Take 1 tablet (4 mg total) by mouth every 8 (eight) hours as needed for nausea or vomiting. 11/01/18   Long, Arlyss Repress, MD    Family History Family History  Problem Relation Age of Onset   Hypertension Brother     Social History Social History   Tobacco Use   Smoking status: Never   Smokeless tobacco: Never  Substance Use Topics   Alcohol use:  Yes    Comment: Socially    Drug use: No     Allergies   Patient has no known allergies.   Review of Systems Review of Systems  All other systems reviewed and are negative. Per HPI  Physical Exam Triage Vital Signs ED Triage Vitals  Enc Vitals Group     BP 06/07/21 0839 131/86     Pulse Rate 06/07/21 0839 78     Resp 06/07/21 0839 19     Temp 06/07/21 0839 98.1 F (36.7 C)     Temp Source 06/07/21 0839 Oral     SpO2 06/07/21 0839 97 %     Weight --      Height --      Head Circumference --      Peak Flow --      Pain Score 06/07/21 0837 8     Pain Loc --      Pain Edu? --      Excl. in GC? --    No data found.  Updated Vital Signs BP 131/86 (BP Location: Right Arm)   Pulse 78   Temp 98.1 F (36.7 C) (Oral)   Resp 19   SpO2 97%   Visual Acuity  Right Eye Distance:   Left Eye Distance:   Bilateral Distance:    Right Eye Near:   Left Eye Near:    Bilateral Near:     Physical Exam Constitutional:      General: He is not in acute distress.    Appearance: He is well-developed. He is not ill-appearing or toxic-appearing.  HENT:     Head: Normocephalic and atraumatic.  Cardiovascular:     Rate and Rhythm: Normal rate and regular rhythm.  Pulmonary:     Effort: Pulmonary effort is normal. No respiratory distress.     Breath sounds: Normal breath sounds. No stridor. No wheezing, rhonchi or rales.  Musculoskeletal:     Comments: Chest Wall Inspection: No obvious deformities, no ecchymosis or erythema. Palpation: There is tenderness to palpation ribs 3 through 8 anteriorly on the right, there is also mild tenderness palpation ribs 7 through 9 on the left.  No crepitus palpated. ROM: Full range of motion of bilateral upper extremities. Strength: Strength preserved in his bilateral upper extremities. Neurovascular: Neurovascularly intact distally.        Neurological:     Mental Status: He is alert.     UC Treatments / Results  Labs (all labs  ordered are listed, but only abnormal results are displayed) Labs Reviewed - No data to display  EKG   Radiology DG Ribs Unilateral W/Chest Right  Result Date: 06/07/2021 CLINICAL DATA:  Right rib pain after fall, initial encounter. EXAM: RIGHT RIBS AND CHEST - 3+ VIEW COMPARISON:  11/01/2018. FINDINGS: Trachea is midline. Heart size normal. Minimal streaky atelectasis at the right lung base. Lungs are otherwise clear. No pleural fluid. No pneumothorax. Dedicated views of the right ribs show no definite fracture. IMPRESSION: No acute findings. Electronically Signed   By: Leanna Battles M.D.   On: 06/07/2021 09:38    Procedures Procedures (including critical care time)  Medications Ordered in UC Medications - No data to display  Initial Impression / Assessment and Plan / UC Course  I have reviewed the triage vital signs and the nursing notes.  Pertinent labs & imaging results that were available during my care of the patient were reviewed by me and considered in my medical decision making (see chart for details).     Patient is a 39 yo make with right sided greater than left sided rib pain after a fall two days ago.  Has slow improvement of pain since yesterday.  XR negative for fracture, diagnosis is likely a contusion.   Can take tylenol or iburopfen prn for pain and use ice as well.  Advised to RTC if pain changes in nature or more left sided, if develops difficulty breathing.  He was discharged home in stable condition.   Final Clinical Impressions(s) / UC Diagnoses   Final diagnoses:  Rib pain on right side     Discharge Instructions      Your x-rays did not show any fractures in your ribs.  You have bruised your ribs, which can be painful, but should improve over the next week.  You can take ibuprofen or tylenol as needed for your pain and ice the area 15 min at a time, 3 times a day.  If you have a change or worsening in your pain, especially if it is more on the left  side of your chest, or if you have difficulty breathing, you should be seen by a medical provider right away.     ED Prescriptions  None    PDMP not reviewed this encounter.   Unknown Jim, DO 06/07/21 [phone]

## 2021-09-18 ENCOUNTER — Other Ambulatory Visit: Payer: Self-pay

## 2021-09-18 ENCOUNTER — Ambulatory Visit (HOSPITAL_COMMUNITY)
Admission: EM | Admit: 2021-09-18 | Discharge: 2021-09-18 | Disposition: A | Discharge status: Home / Self Care | Payer: Self-pay | Attending: Family Medicine | Admitting: Family Medicine

## 2021-09-18 ENCOUNTER — Encounter (HOSPITAL_COMMUNITY): Payer: Self-pay

## 2021-09-18 DIAGNOSIS — N342 Other urethritis: Secondary | ICD-10-CM | POA: Insufficient documentation

## 2021-09-18 NOTE — ED Triage Notes (Signed)
Pt presents with c/o penile discomfort and itching.   States it randomly started 2 days ago.   Denies urinary issues.

## 2021-09-18 NOTE — ED Provider Notes (Signed)
[facility]    CSN: [id number] Arrival date & time: 09/18/21  0830      History   Chief Complaint Chief Complaint  Patient presents with   penile itching    HPI Steven Farley is a 41 y.o. male.   HPI Here with a 3-day history of itching of his penis after urination.  He notes a little right-sided abdominal pain off and on.  No fever or chills.  No nausea or vomiting or diarrhea.  On review of the chart he has had gonorrhea and also trichomonas in the past History reviewed. No pertinent past medical history.  Patient Active Problem List   Diagnosis Date Noted   Pain in joint, pelvic region and thigh 10/24/2016    Past Surgical History:  Procedure Laterality Date   FRACTURE SURGERY     Nose (1991)   NASAL SINUS SURGERY         Home Medications    Prior to Admission medications   Medication Sig Start Date End Date Taking? Authorizing Provider  acetaminophen (TYLENOL) 650 MG CR tablet Take 1,300 mg by mouth every 8 (eight) hours as needed for pain.    [provider]  Artificial Tear Ointment (DRY EYES OP) Apply 1-2 drops to eye 3 (three) times daily as needed (for dry eyes).    [provider]  dicyclomine (BENTYL) 20 MG tablet Take 1 tablet (20 mg total) by mouth 3 (three) times daily as needed for spasms (abdominal cramping). 11/01/18   Long, Arlyss Repress, MD  ondansetron (ZOFRAN ODT) 4 MG disintegrating tablet Take 1 tablet (4 mg total) by mouth every 8 (eight) hours as needed for nausea or vomiting. 11/01/18   Long, Arlyss Repress, MD    Family History Family History  Problem Relation Age of Onset   Hypertension Brother     Social History Social History   Tobacco Use   Smoking status: Never   Smokeless tobacco: Never  Substance Use Topics   Alcohol use: Yes    Comment: Socially    Drug use: No     Allergies   Patient has no known allergies.   Review of Systems Review of Systems   Physical Exam Triage Vital Signs ED  Triage Vitals [09/18/21 0922]  Enc Vitals Group     BP (!) 145/93     Pulse Rate 90     Resp 18     Temp 98.2 F (36.8 C)     Temp Source Oral     SpO2 97 %     Weight      Height      Head Circumference      Peak Flow      Pain Score 0     Pain Loc      Pain Edu?      Excl. in GC?    No data found.  Updated Vital Signs BP (!) 145/93 (BP Location: Left Arm)    Pulse 90    Temp 98.2 F (36.8 C) (Oral)    Resp 18    SpO2 97%   Visual Acuity Right Eye Distance:   Left Eye Distance:   Bilateral Distance:    Right Eye Near:   Left Eye Near:    Bilateral Near:     Physical Exam Vitals reviewed.  Constitutional:      General: He is not in acute distress.    Appearance: He is not toxic-appearing.  HENT:  Mouth/Throat:     Mouth: Mucous membranes are moist.  Eyes:     Extraocular Movements: Extraocular movements intact.     Conjunctiva/sclera: Conjunctivae normal.     Pupils: Pupils are equal, round, and reactive to light.  Cardiovascular:     Rate and Rhythm: Normal rate and regular rhythm.     Heart sounds: No murmur heard. Pulmonary:     Effort: Pulmonary effort is normal.     Breath sounds: Normal breath sounds.  Abdominal:     General: Bowel sounds are normal.     Palpations: Abdomen is soft. There is no mass.     Tenderness: There is no abdominal tenderness.  Musculoskeletal:     Cervical back: Neck supple.  Lymphadenopathy:     Cervical: No cervical adenopathy.  Skin:    Capillary Refill: Capillary refill takes less than 2 seconds.     Coloration: Skin is not jaundiced or pale.  Neurological:     Mental Status: He is alert and oriented to person, place, and time.  Psychiatric:        Behavior: Behavior normal.     UC Treatments / Results  Labs (all labs ordered are listed, but only abnormal results are displayed) Labs Reviewed - No data to display  EKG   Radiology No results found.  Procedures Procedures (including critical care  time)  Medications Ordered in UC Medications - No data to display  Initial Impression / Assessment and Plan / UC Course  I have reviewed the triage vital signs and the nursing notes.  Pertinent labs & imaging results that were available during my care of the patient were reviewed by me and considered in my medical decision making (see chart for details).     Will do the self swab and treat any positive results. Final Clinical Impressions(s) / UC Diagnoses   Final diagnoses:  Urethritis     Discharge Instructions      Our staff will call you if tests are positive, for treatment for any positive results (las enfermeras le hablaria a decirle si hay pruebas positivas, y le diria si necesita tratamiento)     ED Prescriptions   None    PDMP not reviewed this encounter.   Zenia Resides, MD 09/18/21 [phone]

## 2021-09-18 NOTE — Discharge Instructions (Addendum)
Our staff will call you if tests are positive, for treatment for any positive results (las enfermeras le hablaria a decirle si hay pruebas positivas, y le diria si necesita tratamiento)

## 2021-09-19 LAB — CYTOLOGY, (ORAL, ANAL, URETHRAL) ANCILLARY ONLY
Chlamydia: NEGATIVE
Comment: NEGATIVE
Comment: NEGATIVE
Comment: NORMAL
Neisseria Gonorrhea: NEGATIVE
Trichomonas: POSITIVE — AB

## 2021-09-20 ENCOUNTER — Telehealth (HOSPITAL_COMMUNITY): Payer: Self-pay | Admitting: Emergency Medicine

## 2021-09-20 MED ORDER — METRONIDAZOLE 500 MG PO TABS: 2000.0000 mg | ORAL_TABLET | Freq: Once | ORAL | 0 refills | Status: AC

## 2021-09-23 ENCOUNTER — Other Ambulatory Visit: Payer: Self-pay

## 2021-09-23 ENCOUNTER — Ambulatory Visit (HOSPITAL_COMMUNITY)
Admission: EM | Admit: 2021-09-23 | Discharge: 2021-09-23 | Disposition: A | Discharge status: Home / Self Care | Payer: Self-pay | Attending: Physician Assistant | Admitting: Physician Assistant

## 2021-09-23 ENCOUNTER — Encounter (HOSPITAL_COMMUNITY): Payer: Self-pay | Admitting: Emergency Medicine

## 2021-09-23 DIAGNOSIS — Z8619 Personal history of other infectious and parasitic diseases: Secondary | ICD-10-CM | POA: Insufficient documentation

## 2021-09-23 DIAGNOSIS — N342 Other urethritis: Secondary | ICD-10-CM | POA: Insufficient documentation

## 2021-09-23 LAB — POCT URINALYSIS DIPSTICK, ED / UC
Bilirubin Urine: NEGATIVE
Glucose, UA: NEGATIVE mg/dL
Ketones, ur: NEGATIVE mg/dL
Leukocytes,Ua: NEGATIVE
Nitrite: NEGATIVE
Protein, ur: NEGATIVE mg/dL
Specific Gravity, Urine: 1.01 (ref 1.005–1.030)
Urobilinogen, UA: 0.2 mg/dL (ref 0.0–1.0)
pH: 7 (ref 5.0–8.0)

## 2021-09-23 MED ORDER — METRONIDAZOLE 500 MG PO TABS: 500.0000 mg | ORAL_TABLET | Freq: Two times a day (BID) | ORAL | 0 refills | Status: DC

## 2021-09-23 NOTE — ED Triage Notes (Signed)
Patient was seen 1/23.  Burning with urination, has itching.  Patient took 4 pills prescribed and has less discomfort, but still has discomfort.

## 2021-09-23 NOTE — Discharge Instructions (Signed)
Take metronidazole twice daily for 7 days.  We will contact you if we need to arrange additional treatment.  Do not drink alcohol while on this medication for 3 days after completing course.  Abstain from sex until 7 days after completing treatment.  All partners need to be tested and treated as well.  If anything worsens please return for reevaluation.  If symptoms do not improve please follow-up with urology.

## 2021-09-23 NOTE — ED Provider Notes (Signed)
[facility]    CSN: [id number] Arrival date & time: 09/23/21  1329      History   Chief Complaint Chief Complaint  Patient presents with   Follow-up    HPI Steven Farley is a 41 y.o. male.   Patient presents today with a weeklong history of urethritis.  Patient is Spanish-speaking and video interpreter was utilized during visit.  He was seen on 09/18/2021 at which point STI swab collected was positive for trichomonas and he was prescribed metronidazole which she reports taking as prescribed.  Despite use of this medication he continues to have symptoms.  Reports that prior to treatment he was having burning sensation/urethritis consistently and now this is only occurring with micturition.  He reports ongoing dysuria and frequency/urgency.  Denies any abdominal pain, fever, nausea, vomiting, penile discharge, genital lesions or rash.  He has not tried additional over-the-counter medication.  He does report a history of UTI in the past.  Denies additional antibiotics outside metronidazole prescribed by us.  He denies any self-catheterization, history of nephrolithiasis, seeing a urologist.   History reviewed. No pertinent past medical history.  Patient Active Problem List   Diagnosis Date Noted   Pain in joint, pelvic region and thigh 10/24/2016    Past Surgical History:  Procedure Laterality Date   FRACTURE SURGERY     Nose (1991)   NASAL SINUS SURGERY         Home Medications    Prior to Admission medications   Medication Sig Start Date End Date Taking? Authorizing Provider  metroNIDAZOLE (FLAGYL) 500 MG tablet Take 1 tablet (500 mg total) by mouth 2 (two) times daily. 09/23/21  Yes Spence Soberano, Noberto RetortErin K, PA-C  acetaminophen (TYLENOL) 650 MG CR tablet Take 1,300 mg by mouth every 8 (eight) hours as needed for pain.    [provider]  Artificial Tear Ointment (DRY EYES OP) Apply 1-2 drops to eye 3 (three) times daily as needed (for dry eyes).    [provider]  dicyclomine (BENTYL) 20 MG tablet Take 1 tablet (20 mg total) by mouth 3 (three) times daily as needed for spasms (abdominal cramping). 11/01/18   Long, Arlyss RepressJoshua G, MD  ondansetron (ZOFRAN ODT) 4 MG disintegrating tablet Take 1 tablet (4 mg total) by mouth every 8 (eight) hours as needed for nausea or vomiting. 11/01/18   Long, Arlyss RepressJoshua G, MD    Family History Family History  Problem Relation Age of Onset   Hypertension Brother     Social History Social History   Tobacco Use   Smoking status: Never   Smokeless tobacco: Never  Vaping Use   Vaping Use: Never used  Substance Use Topics   Alcohol use: Yes    Comment: Socially    Drug use: No     Allergies   Patient has no known allergies.   Review of Systems Review of Systems  Constitutional:  Positive for activity change. Negative for appetite change, fatigue and fever.  Respiratory:  Negative for cough and shortness of breath.   Cardiovascular:  Negative for chest pain.  Gastrointestinal:  Negative for abdominal pain, diarrhea, nausea and vomiting.  Genitourinary:  Positive for dysuria, frequency and urgency. Negative for genital sores, penile discharge, penile pain, penile swelling and scrotal swelling.  Neurological:  Negative for dizziness, light-headedness and headaches.    Physical Exam Triage Vital Signs ED Triage Vitals  Enc Vitals Group     BP 09/23/21 1516 (!) 154/95  Pulse Rate 09/23/21 1516 71     Resp 09/23/21 1516 18     Temp 09/23/21 1516 98.1 F (36.7 C)     Temp Source 09/23/21 1516 Oral     SpO2 09/23/21 1516 99 %     Weight --      Height --      Head Circumference --      Peak Flow --      Pain Score 09/23/21 1512 6     Pain Loc --      Pain Edu? --      Excl. in GC? --    No data found.  Updated Vital Signs BP (!) 154/95 (BP Location: Right Arm)    Pulse 71    Temp 98.1 F (36.7 C) (Oral)    Resp 18    SpO2 99%   Visual Acuity Right Eye Distance:   Left Eye Distance:    Bilateral Distance:    Right Eye Near:   Left Eye Near:    Bilateral Near:     Physical Exam Vitals reviewed.  Constitutional:      General: He is awake.     Appearance: Normal appearance. He is well-developed. He is not ill-appearing.     Comments: Very pleasant male appears stated age in no acute distress sitting comfortably in exam room  HENT:     Head: Normocephalic and atraumatic.  Cardiovascular:     Rate and Rhythm: Normal rate and regular rhythm.     Heart sounds: Normal heart sounds, S1 normal and S2 normal. No murmur heard. Pulmonary:     Effort: Pulmonary effort is normal.     Breath sounds: Normal breath sounds. No stridor. No wheezing, rhonchi or rales.     Comments: Clear to auscultation bilaterally Abdominal:     General: Bowel sounds are normal.     Palpations: Abdomen is soft.     Tenderness: There is no abdominal tenderness. There is no right CVA tenderness, left CVA tenderness, guarding or rebound.     Comments: Benign abdominal exam  Genitourinary:    Comments: Exam deferred Neurological:     Mental Status: He is alert.  Psychiatric:        Behavior: Behavior is cooperative.     UC Treatments / Results  Labs (all labs ordered are listed, but only abnormal results are displayed) Labs Reviewed  POCT URINALYSIS DIPSTICK, ED / UC - Abnormal; Notable for the following components:      Result Value   Hgb urine dipstick TRACE (*)    All other components within normal limits  URINE CULTURE  CYTOLOGY, (ORAL, ANAL, URETHRAL) ANCILLARY ONLY    EKG   Radiology No results found.  Procedures Procedures (including critical care time)  Medications Ordered in UC Medications - No data to display  Initial Impression / Assessment and Plan / UC Course  I have reviewed the triage vital signs and the nursing notes.  Pertinent labs & imaging results that were available during my care of the patient were reviewed by me and considered in my medical decision  making (see chart for details).     UA showed trace hemoglobin which is been present for over 4 years without evidence of acute infection.  We will send off urine for culture to rule out UTI contributing to symptoms but defer additional antibiotics for the time being.  STI swab was collected today given persistent symptoms.  Patient reports only a 10% improvement after 2  g of metronidazole.  Given recent trichomonas infection will try metronidazole 500 mg twice daily for 7 days in the hopes that this will provide relief of symptoms.  Discussed that he should push fluids.  Discussed that if symptoms or not improving he should follow-up with urology and was given contact information for local provider with recommendation to call them to schedule an appointment if testing is negative and symptoms persist despite treatment.  Discussed alarm symptoms that warrant emergent evaluation.  Strict return precautions given to which he expressed understanding.  Final Clinical Impressions(s) / UC Diagnoses   Final diagnoses:  Urethritis  History of trichomonal urethritis     Discharge Instructions      Take metronidazole twice daily for 7 days.  We will contact you if we need to arrange additional treatment.  Do not drink alcohol while on this medication for 3 days after completing course.  Abstain from sex until 7 days after completing treatment.  All partners need to be tested and treated as well.  If anything worsens please return for reevaluation.  If symptoms do not improve please follow-up with urology.     ED Prescriptions     Medication Sig Dispense Auth. Provider   metroNIDAZOLE (FLAGYL) 500 MG tablet Take 1 tablet (500 mg total) by mouth 2 (two) times daily. 14 tablet Larin Weissberg, Noberto Retort, PA-C      PDMP not reviewed this encounter.   Jeani Hawking, PA-C 09/23/21 1556

## 2021-09-24 LAB — URINE CULTURE: Culture: NO GROWTH

## 2021-09-25 LAB — CYTOLOGY, (ORAL, ANAL, URETHRAL) ANCILLARY ONLY
Chlamydia: NEGATIVE
Comment: NEGATIVE
Comment: NEGATIVE
Comment: NORMAL
Neisseria Gonorrhea: NEGATIVE
Trichomonas: POSITIVE — AB

## 2021-10-15 ENCOUNTER — Other Ambulatory Visit: Payer: Self-pay

## 2021-10-15 ENCOUNTER — Ambulatory Visit (HOSPITAL_COMMUNITY)
Admission: EM | Admit: 2021-10-15 | Discharge: 2021-10-15 | Disposition: A | Discharge status: Home / Self Care | Payer: Self-pay | Attending: Emergency Medicine | Admitting: Emergency Medicine

## 2021-10-15 ENCOUNTER — Encounter (HOSPITAL_COMMUNITY): Payer: Self-pay | Admitting: *Deleted

## 2021-10-15 DIAGNOSIS — N342 Other urethritis: Secondary | ICD-10-CM | POA: Insufficient documentation

## 2021-10-15 LAB — POCT URINALYSIS DIPSTICK, ED / UC
Bilirubin Urine: NEGATIVE
Glucose, UA: NEGATIVE mg/dL
Ketones, ur: NEGATIVE mg/dL
Leukocytes,Ua: NEGATIVE
Nitrite: NEGATIVE
Protein, ur: NEGATIVE mg/dL
Specific Gravity, Urine: 1.02 (ref 1.005–1.030)
Urobilinogen, UA: 0.2 mg/dL (ref 0.0–1.0)
pH: 5.5 (ref 5.0–8.0)

## 2021-10-15 NOTE — Discharge Instructions (Addendum)
The results of your STD testing today will be made available to you once they are complete, this typically takes 3 to 5 days.  They will initially be posted to your MyChart and, if any of your results are abnormal, you will receive a phone call with those results along with further instructions regarding treatment and any prescriptions, if needed.  ?  ?Thank you for visiting urgent care today.  I appreciate the opportunity to participate in your care. ? ?

## 2021-10-15 NOTE — ED Triage Notes (Signed)
Pt returns today with on going dysuria and itching to penis. Pt denies any unprotected sex.

## 2021-10-15 NOTE — ED Provider Notes (Signed)
[facility]    CSN: [id number] Arrival date & time: 10/15/21  1004    HISTORY   Chief Complaint  Patient presents with   Dysuria   ITCHING ON PENIS   HPI Steven Farley is a 41 y.o. male. Pt returns today with on going dysuria and itching to penis.  Patient reports having been treated for this twice.  Patient states that each treatment has provided initial relief but then symptoms have returned full force.  EMR reviewed, patient was tested and treated on January 23 with a single dose of 2 g of metronidazole and was tested and treated again on January 28 with 500 mg of metronidazole twice daily for 7 days.  Pt denies sexual intercourse since initial symptoms began prior to January 23.  Patient states that his symptoms today are exactly the same as before.  EMR further reviewed, patient has a history of also testing positive for gonorrhea.  The history is provided by the patient.  History reviewed. No pertinent past medical history. Patient Active Problem List   Diagnosis Date Noted   Pain in joint, pelvic region and thigh 10/24/2016   Past Surgical History:  Procedure Laterality Date   FRACTURE SURGERY     Nose (1991)   NASAL SINUS SURGERY      Home Medications    Prior to Admission medications   Medication Sig Start Date End Date Taking? Authorizing Provider  acetaminophen (TYLENOL) 650 MG CR tablet Take 1,300 mg by mouth every 8 (eight) hours as needed for pain.    [provider]  Artificial Tear Ointment (DRY EYES OP) Apply 1-2 drops to eye 3 (three) times daily as needed (for dry eyes).    [provider]  dicyclomine (BENTYL) 20 MG tablet Take 1 tablet (20 mg total) by mouth 3 (three) times daily as needed for spasms (abdominal cramping). 11/01/18   Long, Wonda Olds, MD   Family History Family History  Problem Relation Age of Onset   Hypertension Brother    Social History Social History   Tobacco Use   Smoking status: Never    Smokeless tobacco: Never  Vaping Use   Vaping Use: Never used  Substance Use Topics   Alcohol use: Yes    Comment: Socially    Drug use: No   Allergies   Patient has no known allergies.  Review of Systems Review of Systems Pertinent findings noted in history of present illness.   Physical Exam Triage Vital Signs ED Triage Vitals  Enc Vitals Group     BP 06/23/21 0827 (!) 147/82     Pulse Rate 06/23/21 0827 72     Resp 06/23/21 0827 18     Temp 06/23/21 0827 98.3 F (36.8 C)     Temp Source 06/23/21 0827 Oral     SpO2 06/23/21 0827 98 %     Weight --      Height --      Head Circumference --      Peak Flow --      Pain Score 06/23/21 0826 5     Pain Loc --      Pain Edu? --      Excl. in [place]? --   No data found.  Updated Vital Signs BP 126/75    Pulse 76    Temp 98.6 F (37 C)    Resp 18    SpO2 98%   Physical Exam Vitals and nursing note reviewed.  Constitutional:  General: He is not in acute distress.    Appearance: Normal appearance. He is not ill-appearing.  HENT:     Head: Normocephalic and atraumatic.  Eyes:     General: Lids are normal.        Right eye: No discharge.        Left eye: No discharge.     Extraocular Movements: Extraocular movements intact.     Conjunctiva/sclera: Conjunctivae normal.     Right eye: Right conjunctiva is not injected.     Left eye: Left conjunctiva is not injected.  Neck:     Trachea: Trachea and phonation normal.  Cardiovascular:     Rate and Rhythm: Normal rate and regular rhythm.     Pulses: Normal pulses.     Heart sounds: Normal heart sounds. No murmur heard.   No friction rub. No gallop.  Pulmonary:     Effort: Pulmonary effort is normal. No accessory muscle usage, prolonged expiration or respiratory distress.     Breath sounds: Normal breath sounds. No stridor, decreased air movement or transmitted upper airway sounds. No decreased breath sounds, wheezing, rhonchi or rales.  Chest:     Chest wall: No  tenderness.  Genitourinary:    Comments: Pt politely declines GU exam, pt did provide a penile swab for testing.   Musculoskeletal:        General: Normal range of motion.     Cervical back: Normal range of motion and neck supple. Normal range of motion.  Lymphadenopathy:     Cervical: No cervical adenopathy.  Skin:    General: Skin is warm and dry.     Findings: No erythema or rash.  Neurological:     General: No focal deficit present.     Mental Status: He is alert and oriented to person, place, and time.  Psychiatric:        Mood and Affect: Mood normal.        Behavior: Behavior normal.    Visual Acuity Right Eye Distance:   Left Eye Distance:   Bilateral Distance:    Right Eye Near:   Left Eye Near:    Bilateral Near:     UC Couse / Diagnostics / Procedures:    EKG  Radiology No results found.  Procedures Procedures (including critical care time)  UC Diagnoses / Final Clinical Impressions(s)   I have reviewed the triage vital signs and the nursing notes.  Pertinent labs & imaging results that were available during my care of the patient were reviewed by me and considered in my medical decision making (see chart for details).    Final diagnoses:  Urethritis   STD screening was performed, patient advised that the results be posted to their MyChart and if any of the results are positive, they will be notified by phone, further treatment will be provided as indicated based on results of STD screening. If patient tests positive for trichomoniasis again, I recommend that he be treated with 2 g of metronidazole one daily for 7 days for a total of 14 g. Return precautions advised.  Drug allergies reviewed, all questions addressed.   Urine dip today was positive for microscopic hematuria.  Urine culture will be performed per our protocol.   Patient advised that they will be contacted with results of the urine culture and that treatment will be provided as indicated  based on results. Return precautions advised.  ED Prescriptions   None    PDMP not reviewed this encounter.  Pending results:  Labs Reviewed  POCT URINALYSIS DIPSTICK, ED / UC - Abnormal; Notable for the following components:      Result Value   Hgb urine dipstick SMALL (*)    All other components within normal limits  URINE CULTURE  CYTOLOGY, (ORAL, ANAL, URETHRAL) ANCILLARY ONLY    Medications Ordered in UC: Medications - No data to display  Disposition Upon Discharge:  Condition: stable for discharge home  Patient presented with concern for an acute illness with associated systemic symptoms and significant discomfort requiring urgent management. In my opinion, this is a condition that a prudent lay person (someone who possesses an average knowledge of health and medicine) may potentially expect to result in complications if not addressed urgently such as respiratory distress, impairment of bodily function or dysfunction of bodily organs.   As such, the patient has been evaluated and assessed, work-up was performed and treatment was provided in alignment with urgent care protocols and evidence based medicine.  Patient/parent/caregiver has been advised that the patient may require follow up for further testing and/or treatment if the symptoms continue in spite of treatment, as clinically indicated and appropriate.  Routine symptom specific, illness specific and/or disease specific instructions were discussed with the patient and/or caregiver at length.  Prevention strategies for avoiding STD exposure were also discussed.  The patient will follow up with their current PCP if and as advised. If the patient does not currently have a PCP we will assist them in obtaining one.   The patient may need specialty follow up if the symptoms continue, in spite of conservative treatment and management, for further workup, evaluation, consultation and treatment as clinically indicated and  appropriate.  Patient/parent/caregiver verbalized understanding and agreement of plan as discussed.  All questions were addressed during visit.  Please see discharge instructions below for further details of plan.  Discharge Instructions:   Discharge Instructions      13    This office note has been dictated using Dragon speech recognition software.  Unfortunately, and despite my best efforts, this method of dictation can sometimes lead to occasional typographical or grammatical errors.  I apologize in advance if this occurs.      Lynden Oxford Scales, PA-C 10/15/21 1145

## 2021-10-16 LAB — CYTOLOGY, (ORAL, ANAL, URETHRAL) ANCILLARY ONLY
Chlamydia: NEGATIVE
Comment: NEGATIVE
Comment: NEGATIVE
Comment: NORMAL
Neisseria Gonorrhea: NEGATIVE
Trichomonas: NEGATIVE

## 2021-10-16 LAB — URINE CULTURE: Culture: NO GROWTH

## 2022-03-22 IMAGING — DX DG RIBS W/ CHEST 3+V*R*
3 series · 3 of 3 positions shown · non-contrast
Comparison: 11/01/2018.

CLINICAL DATA: Right rib pain after fall, initial encounter.

EXAM:
RIGHT RIBS AND CHEST - 3+ VIEW

[chest pa]
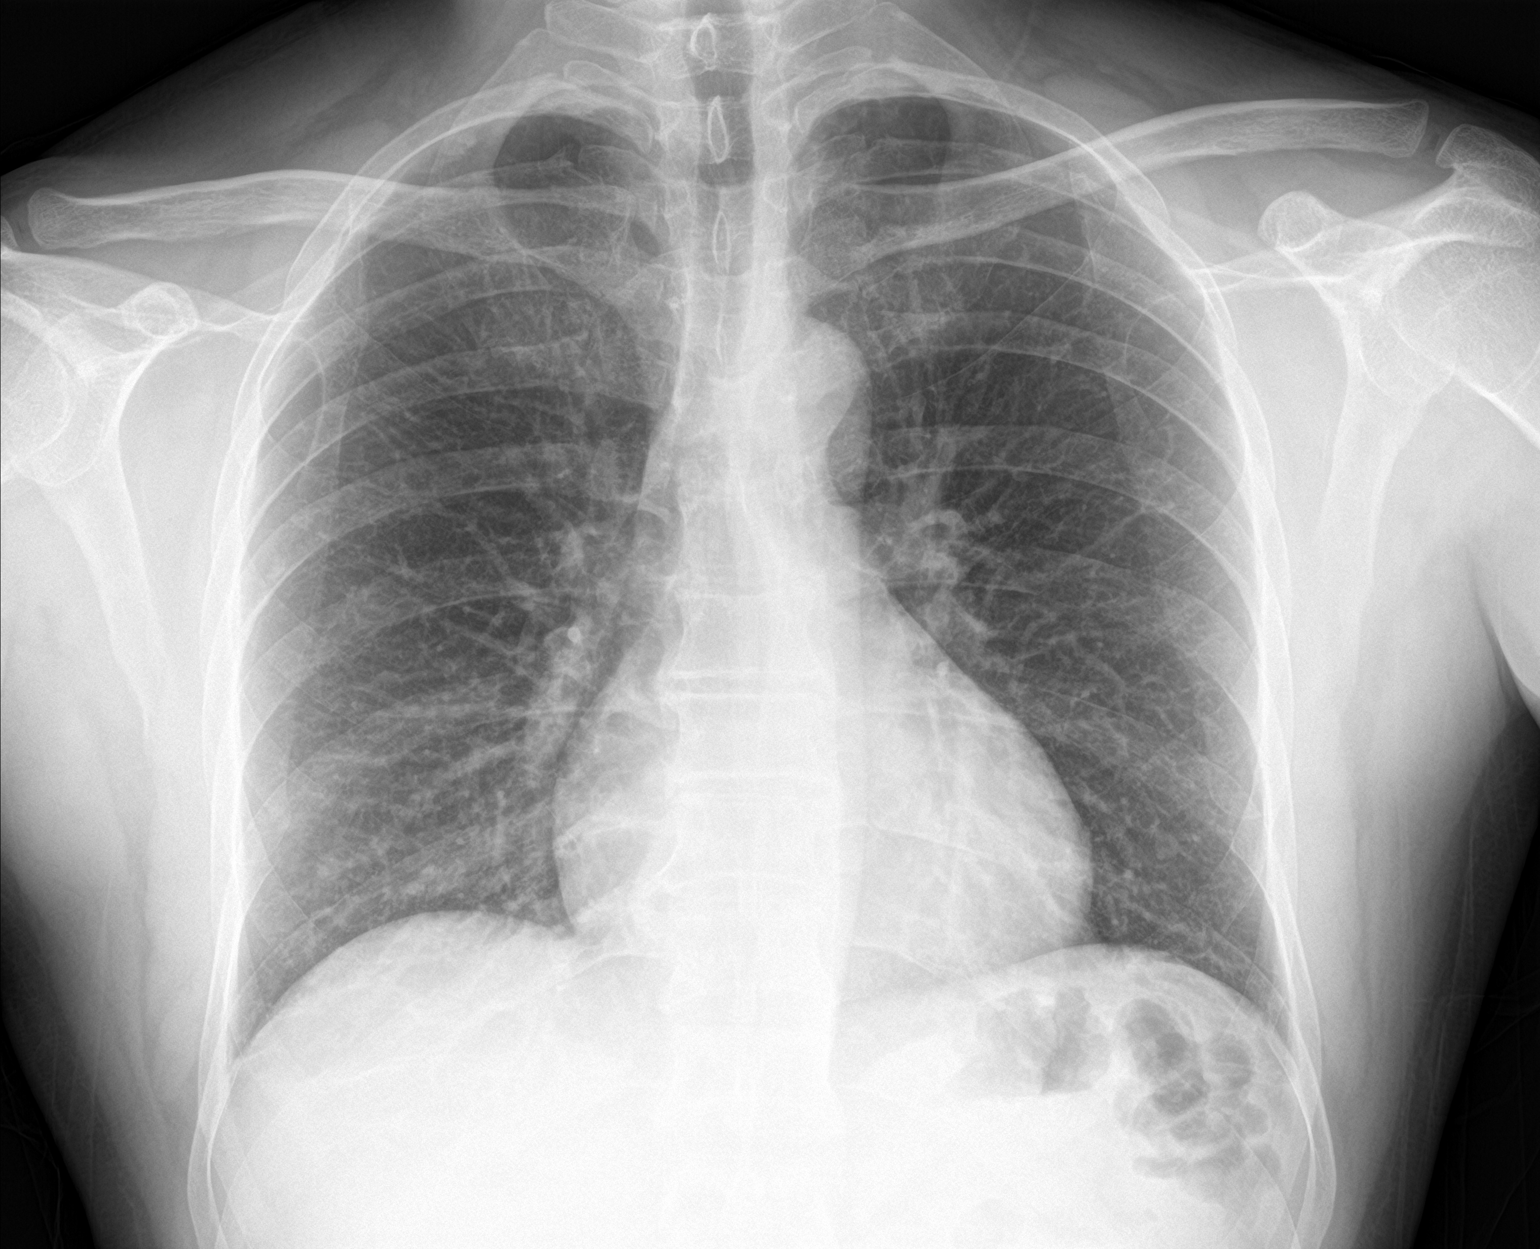

[rib pa]
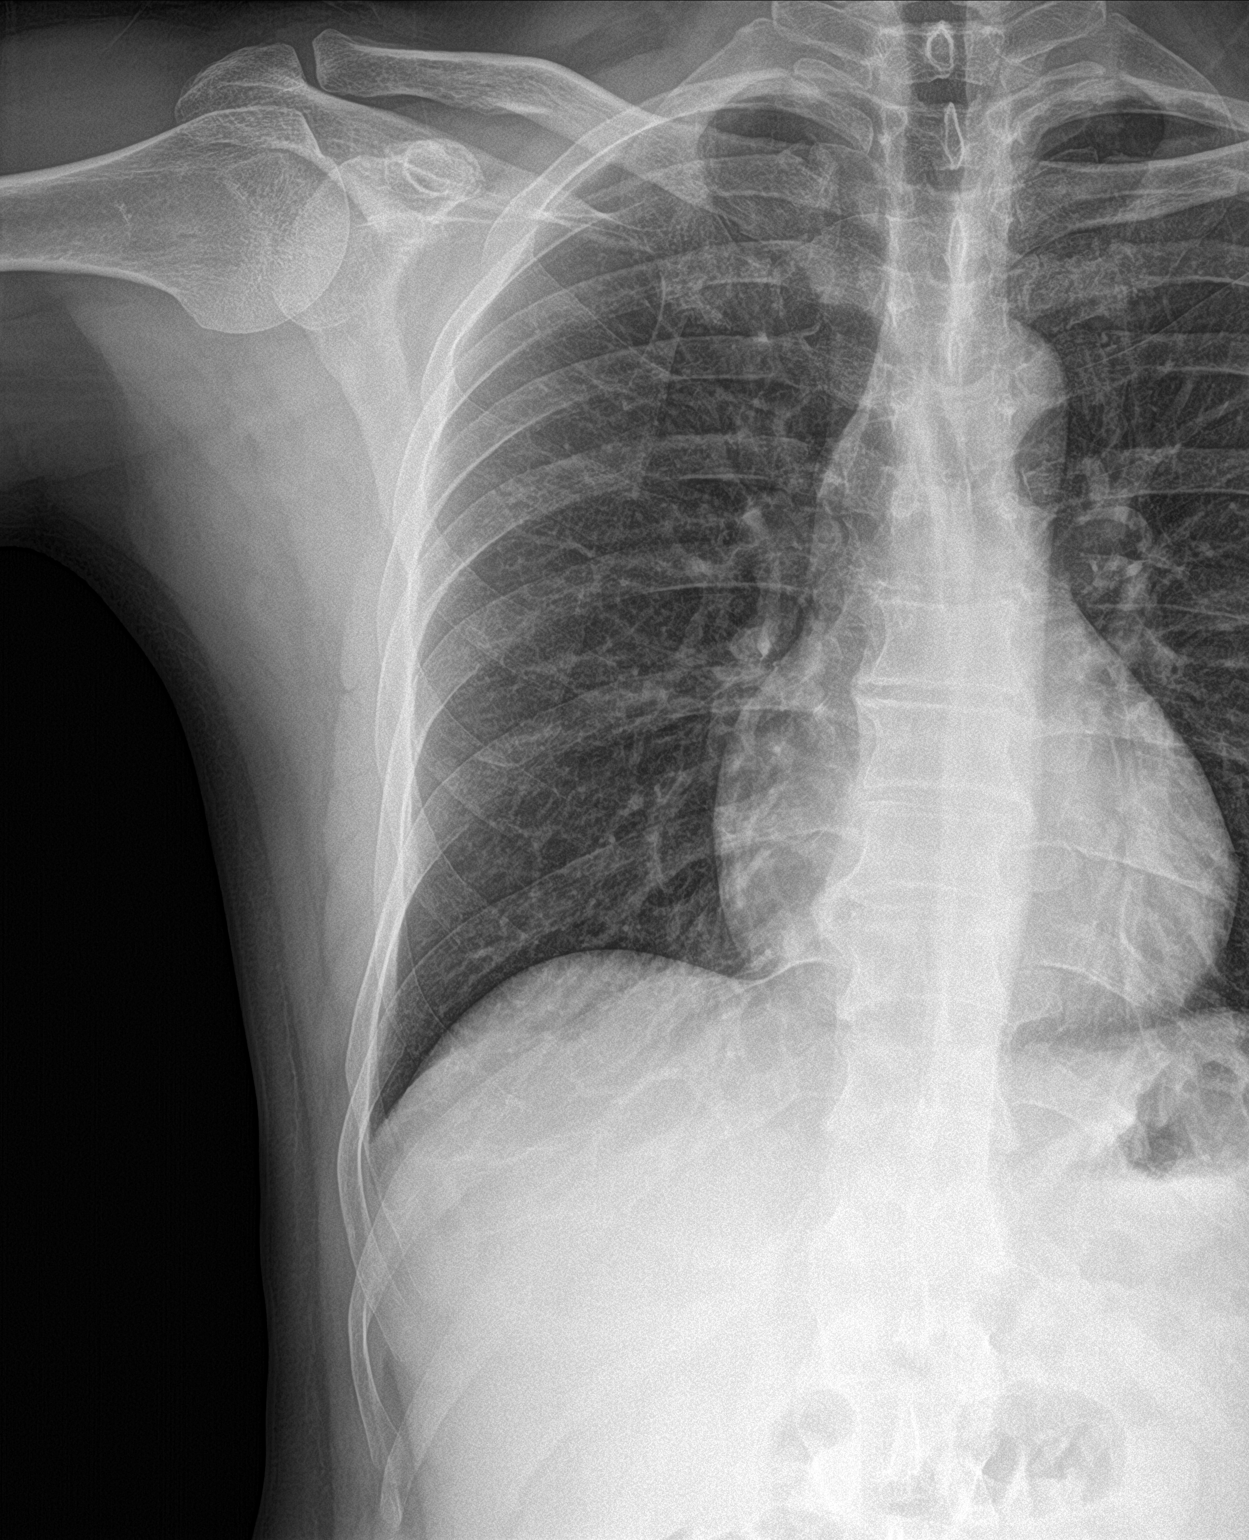

[rib obl]
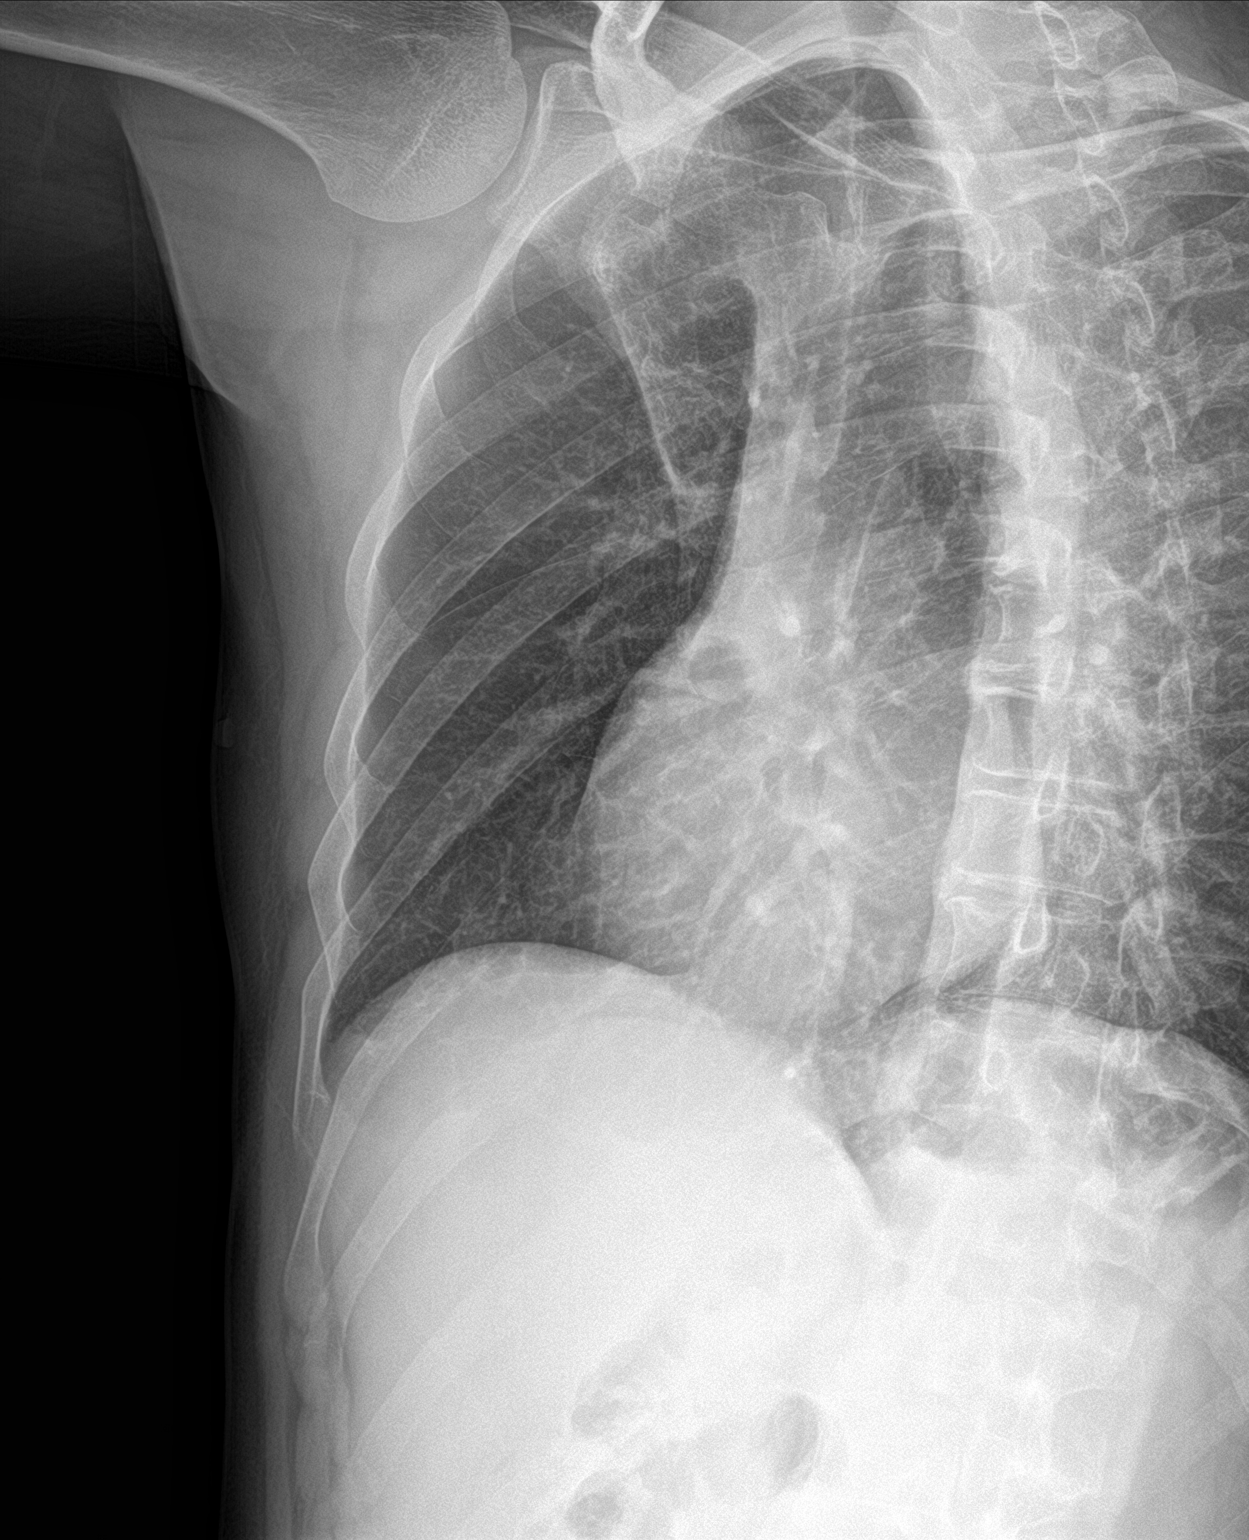

[3 of 3 positions shown; findings below may reference images not displayed]

FINDINGS: Trachea is midline. Heart size normal. Minimal streaky atelectasis
at the right lung base. Lungs are otherwise clear. No pleural fluid.
No pneumothorax. Dedicated views of the right ribs show no definite
fracture.
IMPRESSION: No acute findings.

## 2023-01-18 ENCOUNTER — Ambulatory Visit (HOSPITAL_COMMUNITY)
Admission: EM | Admit: 2023-01-18 | Discharge: 2023-01-18 | Disposition: A | Discharge status: Home / Self Care | Payer: Self-pay | Attending: Internal Medicine | Admitting: Internal Medicine

## 2023-01-18 ENCOUNTER — Encounter (HOSPITAL_COMMUNITY): Payer: Self-pay

## 2023-01-18 DIAGNOSIS — N451 Epididymitis: Secondary | ICD-10-CM | POA: Insufficient documentation

## 2023-01-18 DIAGNOSIS — Z202 Contact with and (suspected) exposure to infections with a predominantly sexual mode of transmission: Secondary | ICD-10-CM | POA: Insufficient documentation

## 2023-01-18 DIAGNOSIS — N5082 Scrotal pain: Secondary | ICD-10-CM | POA: Insufficient documentation

## 2023-01-18 LAB — POCT URINALYSIS DIP (MANUAL ENTRY)
Bilirubin, UA: NEGATIVE
Glucose, UA: NEGATIVE mg/dL
Ketones, POC UA: NEGATIVE mg/dL
Leukocytes, UA: NEGATIVE
Nitrite, UA: NEGATIVE
Protein Ur, POC: NEGATIVE mg/dL
Spec Grav, UA: 1.01 (ref 1.010–1.025)
Urobilinogen, UA: 0.2 E.U./dL
pH, UA: 6 (ref 5.0–8.0)

## 2023-01-18 MED ORDER — CEFTRIAXONE SODIUM 500 MG IJ SOLR
INTRAMUSCULAR | Status: AC
  Filled 2023-01-18: qty 500

## 2023-01-18 MED ORDER — DOXYCYCLINE HYCLATE 100 MG PO CAPS: 100.0000 mg | ORAL_CAPSULE | Freq: Two times a day (BID) | ORAL | 0 refills | Status: AC

## 2023-01-18 MED ORDER — CEFTRIAXONE SODIUM 500 MG IJ SOLR
500.0000 mg | INTRAMUSCULAR | Status: DC
  Administered 2023-01-18: 500 mg via INTRAMUSCULAR

## 2023-01-18 MED ORDER — LIDOCAINE HCL (PF) 1 % IJ SOLN
INTRAMUSCULAR | Status: AC
  Filled 2023-01-18: qty 2

## 2023-01-18 NOTE — Discharge Instructions (Signed)
Me preocupa que pueda tener epididimitis, que es una inflamacin e infeccin de un tubo en el escroto o los testculos.   Hoy le he puesto una inyeccin de Rocephin, que es un antibitico para tratar la infeccin.  Me gustara que tomara doxiciclina dos veces al da durante los prximos 7 das para tratar ms la infeccin.  Su orina es negativa para detectar signos de infeccin del tracto urinario.  El hisopo de deteccin de ETS que recolectamos en la Technical sales engineer en los prximos 2 a 3 das y lo llamar si alguno de los [place] del hisopo es positivo.  Programe una cita con el urlogo que figura en su documentacin segn sea necesario para una evaluacin de seguimiento.  Si sus sntomas no mejoran en los prximos 3 a 4 das con el uso de antibiticos o si desarrolla algn sntoma nuevo o que empeora, como dolor intenso en los testculos, sarpullido, fiebre, escalofros, dolor abdominal o sntomas urinarios nuevos o que [place], regrese a atencin de [place] o vaya al departamento de emergencias ms cercano para una evaluacin adicional.  No tenga relaciones sexuales durante 7 das mientras est en tratamiento para esto.  Puede continuar tomando Tylenol segn sea necesario para Chief Technology Officer.  Use ropa interior de apoyo para proporcionar compresin a los testculos y apoyo.  Espero que se sienta mejor!   I am concerned that you may have epididymitis which is inflammation and infection of a tube in the scrotum/testicles.   I have given you a shot of Rocephin today which is an antibiotic to treat the infection.  I would like for you to take doxycycline twice a day for the next 7 days to treat the infection further.  Your urine is negative for signs of urinary tract infection.  The STD screening swab that we collected in the clinic will come back in the next 2 to 3 days and I will call you if any of the results on the swab are positive.  Please schedule an appointment with the  urologist listed on your paperwork as needed for follow-up evaluation.  If your symptoms do not improve in the next 3 to 4 days with use of antibiotics or if you develop any new or worsening symptoms such as severe pain to the testicles, rash, fever, chills, abdominal pain, or new or worsening urinary symptoms, please return to urgent care or go to the nearest emergency department for further evaluation.  Do not have any sexual intercourse for 7 days while you are being treated for this.  You may continue taking Tylenol as needed for pain.  Wear supportive underwear to provide compression to the testicles and support.  I hope you feel better!

## 2023-01-18 NOTE — ED Triage Notes (Signed)
Patient presenting with Testicular pain bilaterally. States can feel movement and tingling. No discharge or painful urination. No rash or swelling. Onset around a week ago.  Was with a new partner a week ago and used protection.

## 2023-01-18 NOTE — ED Provider Notes (Signed)
[facility]    CSN: [id number] Arrival date & time: 01/18/23  1004      History   Chief Complaint Chief Complaint  Patient presents with   Testicle Pain    HPI Steven Farley is a 42 y.o. male.   Patient presents to urgent care for evaluation of bilateral testicular pain that started 1 week ago after he participated in protected intercourse with new male partner. Describes testicular symptoms as "not a pain but a discomfort that is constant and feels like movement/tingling". He complains of "movement" inside of the bilateral testicles.He cannot identify any factors that make discomfort worse. States he took tylenol last night and this helped slightly with the pain to the point where he was able to rest a bit more comfortably at night. Denies penile discharge, rash, and itching. He has not felt any lumps/bumps to the testicles and denies erythema/warmth to the testicles. Denies recent trauma/injury to the testicles. He has never had this happen before.  No recent known exposure to STD.  Denies urinary frequency, urgency, and dysuria.  Reports slight urinary hesitancy without gross hematuria, fever, chills, abdominal pain, nausea, vomiting, diarrhea, or headaches.  No dizziness reported.  The history is provided by the patient. The history is limited by a language barrier. A language interpreter was used (Spanish medical interpreter used for entirety of patient encounter.).  Testicle Pain    History reviewed. No pertinent past medical history.  Patient Active Problem List   Diagnosis Date Noted   Pain in joint, pelvic region and thigh 10/24/2016    Past Surgical History:  Procedure Laterality Date   FRACTURE SURGERY     Nose (1991)   NASAL SINUS SURGERY         Home Medications    Prior to Admission medications   Medication Sig Start Date End Date Taking? Authorizing Provider  acetaminophen (TYLENOL) 650 MG CR tablet Take 1,300 mg by mouth every 8 (eight)  hours as needed for pain.   Yes [provider]  doxycycline (VIBRAMYCIN) 100 MG capsule Take 1 capsule (100 mg total) by mouth 2 (two) times daily for 7 days. 01/18/23 01/25/23 Yes StanhopeDonavan Burnet, FNP  Artificial Tear Ointment (DRY EYES OP) Apply 1-2 drops to eye 3 (three) times daily as needed (for dry eyes).    [provider]  dicyclomine (BENTYL) 20 MG tablet Take 1 tablet (20 mg total) by mouth 3 (three) times daily as needed for spasms (abdominal cramping). 11/01/18   Long, Arlyss Repress, MD    Family History Family History  Problem Relation Age of Onset   Hypertension Brother     Social History Social History   Tobacco Use   Smoking status: Never   Smokeless tobacco: Never  Vaping Use   Vaping Use: Never used  Substance Use Topics   Alcohol use: Yes    Comment: Socially    Drug use: No     Allergies   Patient has no known allergies.   Review of Systems Review of Systems  Genitourinary:  Positive for testicular pain.  Per HPI   Physical Exam Triage Vital Signs ED Triage Vitals  Enc Vitals Group     BP 01/18/23 1022 (!) 142/90     Pulse Rate 01/18/23 1022 75     Resp 01/18/23 1022 18     Temp 01/18/23 1022 98.9 F (37.2 C)     Temp Source 01/18/23 1022 Oral     SpO2 01/18/23 1022  98 %     Weight --      Height --      Head Circumference --      Peak Flow --      Pain Score 01/18/23 1019 8     Pain Loc --      Pain Edu? --      Excl. in [place]? --    No data found.  Updated Vital Signs BP (!) 142/90 (BP Location: Left Arm)   Pulse 75   Temp 98.9 F (37.2 C) (Oral)   Resp 18   SpO2 98%   Visual Acuity Right Eye Distance:   Left Eye Distance:   Bilateral Distance:    Right Eye Near:   Left Eye Near:    Bilateral Near:     Physical Exam Vitals and nursing note reviewed. Exam conducted with a chaperone present (Arianna, CMA).  Constitutional:      Appearance: He is not ill-appearing or toxic-appearing.  HENT:     Head:  Normocephalic and atraumatic.     Right Ear: Hearing and external ear normal.     Left Ear: Hearing and external ear normal.     Nose: Nose normal.     Mouth/Throat:     Lips: Pink.  Eyes:     General: Lids are normal. Vision grossly intact. Gaze aligned appropriately.     Extraocular Movements: Extraocular movements intact.     Conjunctiva/sclera: Conjunctivae normal.  Pulmonary:     Effort: Pulmonary effort is normal.  Abdominal:     General: Bowel sounds are normal.     Palpations: Abdomen is soft.     Tenderness: There is no abdominal tenderness. There is no right CVA tenderness, left CVA tenderness or guarding.     Hernia: There is no hernia in the left inguinal area or right inguinal area.  Genitourinary:    Penis: Normal and uncircumcised.      Testes:        Right: Swelling present. Testicular hydrocele or varicocele not present.        Left: Swelling present. Testicular hydrocele or varicocele not present.     Epididymis:     Right: Tenderness (Mildly tender to palpation) present.     Left: Tenderness (Mildly tender to palpation) present.     Comments: Subtle swelling to the bilateral testicles. No warmth, erythema, rash, or palpable masses. Pain improves with testicular support on exam and returns when support is released.  Musculoskeletal:     Cervical back: Neck supple.  Skin:    General: Skin is warm and dry.     Capillary Refill: Capillary refill takes less than 2 seconds.     Findings: No rash.  Neurological:     General: No focal deficit present.     Mental Status: He is alert and oriented to person, place, and time. Mental status is at baseline.     Cranial Nerves: No dysarthria or facial asymmetry.  Psychiatric:        Mood and Affect: Mood normal.        Speech: Speech normal.        Behavior: Behavior normal.        Thought Content: Thought content normal.        Judgment: Judgment normal.      UC Treatments / Results  Labs (all labs ordered are  listed, but only abnormal results are displayed) Labs Reviewed  POCT URINALYSIS DIP (MANUAL ENTRY) - Abnormal; Notable for the  following components:      Result Value   Blood, UA trace-lysed (*)    All other components within normal limits  CYTOLOGY, (ORAL, ANAL, URETHRAL) ANCILLARY ONLY    EKG   Radiology No results found.  Procedures Procedures (including critical care time)  Medications Ordered in UC Medications  cefTRIAXone (ROCEPHIN) injection 500 mg (has no administration in time range)    Initial Impression / Assessment and Plan / UC Course  I have reviewed the triage vital signs and the nursing notes.  Pertinent labs & imaging results that were available during my care of the patient were reviewed by me and considered in my medical decision making (see chart for details).   1.  Acute epididymitis Presentation is consistent with acute epididymitis etiology.  Cytoswab is pending and will come back in the next 2 to 3 days, urinalysis is unremarkable for signs of urinary tract infection but does show some trace lysed blood.  Per up-to-date recommendations for treatment for epididymitis in patients who are participating in sexual intercourse outside of monogamous relationship, will treat with ceftriaxone 500 mg IM and doxycycline twice daily for 7 days.  Advised no intercourse for 7 days while he is undergoing treatment for this.  Offered ultrasound of the testicles, however shared decision making used to defer ultrasound of the testicles today and to see if symptoms improve with use of antibiotics for epididymitis.  Low suspicion for testicular torsion, mass, or other emergent etiology. Walking referral to urology provided should symptoms persist or fail to improve. Vitals are hemodynamically stable and he is afebrile. He is agreeable with this plan.   Billing level 4 due to greater than 30 minutes spent patient facing discussing treatment plan and physical exam.  Discussed  physical exam and available lab work findings in clinic with patient.  Counseled patient regarding appropriate use of medications and potential side effects for all medications recommended or prescribed today. Discussed red flag signs and symptoms of worsening condition,when to call the PCP office, return to urgent care, and when to seek higher level of care in the emergency department. Patient verbalizes understanding and agreement with plan. All questions answered. Patient discharged in stable condition.    Final Clinical Impressions(s) / UC Diagnoses   Final diagnoses:  Acute epididymitis     Discharge Instructions      Me preocupa que pueda tener epididimitis, que es una inflamacin e infeccin de un tubo en el escroto o los testculos.   Hoy le he puesto una inyeccin de Rocephin, que es un antibitico para tratar la infeccin.  Me gustara que tomara doxiciclina dos veces al da durante los prximos 7 das para tratar ms la infeccin.  Su orina es negativa para detectar signos de infeccin del tracto urinario.  El hisopo de deteccin de ETS que recolectamos en la Technical sales engineer en los prximos 2 a 3 das y lo llamar si alguno de los [place] del hisopo es positivo.  Programe una cita con el urlogo que figura en su documentacin segn sea necesario para una evaluacin de seguimiento.  Si sus sntomas no mejoran en los prximos 3 a 4 das con el uso de antibiticos o si desarrolla algn sntoma nuevo o que empeora, como dolor intenso en los testculos, sarpullido, fiebre, escalofros, dolor abdominal o sntomas urinarios nuevos o que [place], regrese a atencin de [place] o vaya al departamento de emergencias ms cercano para Hotel manager.  No tenga relaciones sexuales durante 7 [address] est  en tratamiento para esto.  Puede continuar tomando Tylenol segn sea necesario para Chief Technology Officer.  Use ropa interior de apoyo para proporcionar compresin a los  testculos y apoyo.  Espero que se sienta mejor!   I am concerned that you may have epididymitis which is inflammation and infection of a tube in the scrotum/testicles.   I have given you a shot of Rocephin today which is an antibiotic to treat the infection.  I would like for you to take doxycycline twice a day for the next 7 days to treat the infection further.  Your urine is negative for signs of urinary tract infection.  The STD screening swab that we collected in the clinic will come back in the next 2 to 3 days and I will call you if any of the results on the swab are positive.  Please schedule an appointment with the urologist listed on your paperwork as needed for follow-up evaluation.  If your symptoms do not improve in the next 3 to 4 days with use of antibiotics or if you develop any new or worsening symptoms such as severe pain to the testicles, rash, fever, chills, abdominal pain, or new or worsening urinary symptoms, please return to urgent care or go to the nearest emergency department for further evaluation.  Do not have any sexual intercourse for 7 days while you are being treated for this.  You may continue taking Tylenol as needed for pain.  Wear supportive underwear to provide compression to the testicles and support.  I hope you feel better!     ED Prescriptions     Medication Sig Dispense Auth. Provider   doxycycline (VIBRAMYCIN) 100 MG capsule Take 1 capsule (100 mg total) by mouth 2 (two) times daily for 7 days. 14 capsule Carlisle Beers, FNP      PDMP not reviewed this encounter.   Carlisle Beers, [place] 01/18/23 1148

## 2023-01-18 NOTE — ED Notes (Signed)
Mikle Bosworth [phone]

## 2023-01-22 LAB — CYTOLOGY, (ORAL, ANAL, URETHRAL) ANCILLARY ONLY
Chlamydia: NEGATIVE
Comment: NEGATIVE
Comment: NEGATIVE
Comment: NORMAL
Neisseria Gonorrhea: NEGATIVE
Trichomonas: NEGATIVE

## 2023-09-11 ENCOUNTER — Ambulatory Visit: Payer: Self-pay

## 2023-09-11 ENCOUNTER — Other Ambulatory Visit: Payer: Self-pay | Admitting: Occupational Medicine

## 2023-09-11 DIAGNOSIS — M545 Low back pain, unspecified: Secondary | ICD-10-CM
